# Patient Record
Sex: Male | Born: 1995 | Race: White | Hispanic: No | Marital: Single | Smoking: Never smoker
Health system: Southern US, Community
[De-identification: ages and names within clinical notes are randomized; demographics above are authoritative.]

## PROBLEM LIST (undated history)

## (undated) DIAGNOSIS — F419 Anxiety disorder, unspecified: Secondary | ICD-10-CM

## (undated) DIAGNOSIS — F329 Major depressive disorder, single episode, unspecified: Secondary | ICD-10-CM

## (undated) DIAGNOSIS — I10 Essential (primary) hypertension: Secondary | ICD-10-CM

## (undated) DIAGNOSIS — E7800 Pure hypercholesterolemia, unspecified: Secondary | ICD-10-CM

## (undated) DIAGNOSIS — F32A Depression, unspecified: Secondary | ICD-10-CM

## (undated) DIAGNOSIS — G40909 Epilepsy, unspecified, not intractable, without status epilepticus: Secondary | ICD-10-CM

## (undated) DIAGNOSIS — R569 Unspecified convulsions: Secondary | ICD-10-CM

## (undated) HISTORY — DX: Unspecified convulsions: R56.9

## (undated) HISTORY — DX: Major depressive disorder, single episode, unspecified: F32.9

## (undated) HISTORY — PX: MOLE REMOVAL: SHX2046

## (undated) HISTORY — PX: WISDOM TOOTH EXTRACTION: SHX21

## (undated) HISTORY — PX: CIRCUMCISION: SUR203

## (undated) HISTORY — DX: Anxiety disorder, unspecified: F41.9

## (undated) HISTORY — DX: Depression, unspecified: F32.A

## (undated) HISTORY — DX: Epilepsy, unspecified, not intractable, without status epilepticus: G40.909

---
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
## 2016-04-20 ENCOUNTER — Telehealth: Payer: Self-pay

## 2016-04-20 DIAGNOSIS — R569 Unspecified convulsions: Secondary | ICD-10-CM | POA: Insufficient documentation

## 2016-04-20 NOTE — Telephone Encounter (Signed)
Patient's mother called stating that she is aware that he has not been seen since 2015 and that he knows he is 65 now. She states that they are on vacation in [place] and while there, he had a [place] Seizure yesterday. She states that he was taken to the hospital to be seen. She states that she would like to know if he could be seen here again and if not, what recommendations do you have for a Neurologist. She is requesting a call back.   CB:[phone]

## 2016-04-20 NOTE — Telephone Encounter (Signed)
14 minute discussion with the family.  The patient is living in [place] taking care of his grandmother.    He had a 1-2 minute seizure that was witnessed and about 5 minutes of postictal confusion.  He has never had a seizure saw him for motor tics.  He was taken to the emergency department and had a normal CT scan of the brain.  The family has decided to move him home for evaluation and treatment.  We need to set up for next week and  New patient  Evaluation with me the following week.  Please contact Billee Cashing, his primary provider and ask for consultation.  When this is complete, contact the family and arrange for EEG next week.  They will be a [place] until this weekend.  I will review the EEG while I'm away and will be prepared to see him when I return.

## 2016-04-20 NOTE — Telephone Encounter (Signed)
Spoke to mom about the EEG that I have [facility] scheduled for. He is scheduled for April 29, 2016 @ 9:30. She states that she has a 10:00 appointment that same day so she will need to reschedule. I provided her with the number so that she can do it herself.  CB:[phone]

## 2016-04-20 NOTE — Telephone Encounter (Signed)
Noted  

## 2016-04-29 ENCOUNTER — Ambulatory Visit (HOSPITAL_COMMUNITY): Payer: Self-pay

## 2016-04-29 ENCOUNTER — Ambulatory Visit (HOSPITAL_COMMUNITY)
Admission: RE | Admit: 2016-04-29 | Discharge: 2016-04-29 | Disposition: A | Discharge status: Home / Self Care | Payer: BLUE CROSS/BLUE SHIELD | Source: Ambulatory Visit | Attending: Pediatrics | Admitting: Pediatrics

## 2016-04-29 DIAGNOSIS — R9401 Abnormal electroencephalogram [EEG]: Secondary | ICD-10-CM | POA: Diagnosis not present

## 2016-04-29 DIAGNOSIS — R569 Unspecified convulsions: Secondary | ICD-10-CM

## 2016-04-29 NOTE — Progress Notes (Signed)
OP routine EEG completed, results pending. 

## 2016-04-30 NOTE — Procedures (Signed)
Patient:  Luis Bailey   Sex: male  DOB:  1996/03/08  Date of study: 04/29/2016  Clinical history: This is a 20 year old young male with an episode of seizure activity lasting for around 2 minutes. He initially had a sensation of losing focus then he fell to the floor with loss of consciousness, tongue biting but no urinary incontinence. This followed by 5 minutes of postictal sleepiness and confusion. No previous history of epilepsy and no family history of seizure. EEG was done to evaluate for possible epileptic event.  Medication: Clonidine, fluoxetine   Procedure: The tracing was carried out on a 32 channel digital Cadwell recorder reformatted into 16 channel montages with 1 devoted to EKG.  The 10 /20 international system electrode placement was used. Recording was done during awake state. Recording time 26 Minutes.   Description of findings: Background rhythm consists of amplitude of 15 microvolt and frequency of 9 hertz posterior dominant rhythm. There was slight anterior posterior gradient noted. Background was well organized, continuous and symmetric but with significant low amplitude throughout as well as slight generalized slowing for the age. There were frequent muscle and lead artifacts noted. There was no sleep noted but patient had slight drowsy state. Hyperventilation resulted in slight slowing of the background activity. Photic stimulation using stepwise increase in photic frequency resulted in bilateral symmetric driving response. Throughout the recording there were occasional sharply contoured waves in the form of single generalized discharges or brief clusters of generalized sharps and spikes noted. There were no transient rhythmic activities or electrographic seizures noted. One lead EKG rhythm strip revealed sinus rhythm at a rate of 90 bpm.  Impression: This EEG is abnormal due to significant low amplitude, slight slowing of the background activity as well as episodes of  brief generalized discharges as mentioned. The findings could be hypersynchrony at the beginning of drowsiness or could be epileptiform discharges with increased epileptic potential, associated with lower seizure threshold and require careful clinical correlation.    Keturah Shavers, MD

## 2016-05-05 ENCOUNTER — Telehealth: Payer: Self-pay | Admitting: Pediatrics

## 2016-05-05 NOTE — Telephone Encounter (Signed)
I left messages on the home phone and the mobile phone to call me for EEG results.

## 2016-05-06 NOTE — Telephone Encounter (Signed)
I spoke with the family for about 4 1/2 minutes.  In my opinion EEG showed tics not seizures.  I have some records from [place] to review.  I will see this patient on Monday.  I will discuss his ability to drive at that time.

## 2016-05-06 NOTE — Telephone Encounter (Signed)
Patient's father returned the missed call from yesterday.   CB:[phone]

## 2016-05-07 NOTE — Procedures (Signed)
Patient: Luis Bailey MRN: [id number] Sex: male DOB: July 09, 1996  Clinical History: Luis Bailey is a 20 y.o. with an episode of seizure activity lasting for around 2 minutes. He initially had a sensation of losing focus then he fell to the floor with loss of consciousness, tongue biting but no urinary incontinence. This followed by 5 minutes of postictal sleepiness and confusion. No previous history of epilepsy and no family history of seizure. EEG was done to evaluate for possible epileptic event.  He has a history of active motor tics.  Medications: Clonidine, fluoxetine  Procedure: The tracing is carried out on a 32-channel digital Cadwell recorder, reformatted into 16-channel montages with 1 devoted to EKG.  The patient was awake during the recording.  The international 10/20 system lead placement used.  Recording time 26 minutes.   Description of Findings: Dominant frequency is 15 V, 9 Hz, alpha range activity that is well modulated and well regulated, posteriorly and symmetrically distributed, and attenuates with eye opening.    Background activity consists of a mixture of low-voltage beta and alpha range activity with rare theta range components.  Theta activity becomes more prominent with drowsiness>  The patient did not drifted into natural sleep.  Throughout the record there was frequent muscle artifact that was associated with facial tic movements involving the eyes and jaw.  There was no interictal epileptiform activity in the form of spikes or sharp waves.  Activating procedures included intermittent photic stimulation, and hyperventilation.  Intermittent photic stimulation induced a bilateral driving response.  Hyperventilation caused mild diffuse background slowing.  EKG showed a regular sinus rhythm with a ventricular response of 90 beats per minute.  Impression: This is a normal record with the patient awake.  This report was redictated after I reviewed it.  Luis CarwinWilliam Hickling,  MD

## 2016-05-11 ENCOUNTER — Ambulatory Visit (INDEPENDENT_AMBULATORY_CARE_PROVIDER_SITE_OTHER): Payer: BLUE CROSS/BLUE SHIELD | Admitting: Pediatrics

## 2016-05-11 ENCOUNTER — Encounter: Payer: Self-pay | Admitting: Pediatrics

## 2016-05-11 VITALS — BP 136/76 | HR 84 | Ht 75.25 in | Wt 286.8 lb

## 2016-05-11 DIAGNOSIS — R569 Unspecified convulsions: Secondary | ICD-10-CM

## 2016-05-11 DIAGNOSIS — G2569 Other tics of organic origin: Secondary | ICD-10-CM

## 2016-05-11 NOTE — Progress Notes (Signed)
Patient: Luis Bailey MRN: [id number] Sex: male DOB: [birth year]  Provider: Deetta PerlaHICKLING,WILLIAM H, MD Location of Care: [facility]  Note type: Routine return visit  History of Present Illness: Referral Source: Griselda MinerSara C. Karleen HampshireSpencer, PA-C History from: both parents, patient and [facility] chart Chief Complaint: Tics of organic origin  Luis Bailey is a 20 y.o. male who returns on May 11, 2016 for the first time since August 09, 2014.  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  He has had a longstanding problem with depression and insomnia and is followed by [facility] for depressed mood and insomnia.  He has taken clonidine and fluoxetine.  Clonidine was prescribed long time ago for his tics.  He was on a trip to [place] with his family when he had a witnessed generalized tonic-clonic seizure.  He had been up until three in the morning and had a fair amount of alcohol.  He woke up around eight or nine and he was helping to cook a meal when he had a seizure noted at around 11 a.m.    This was generalized tonic-clonic seizure.  It is not clear how long it lasted.  He had cyanosis and apnea.  Fortunately two of the family members are of medical experience one is a Engineer, civil (consulting)nurse and the other paramedic.  They got him on the side and symptoms passed.  He was evaluated in the emergency department.  Copy of that record has been sent to the office and has been scanned into the chart.  He said that he zoned out just prior to his episode and that he had experienced this feeling before, but he has never been that long.    He was awake when he had the seizure which is problematic as regards driving.  An EEG was performed on April 29, 2016.  I reviewed it and disagreed with the interpretation of generalized spike and slow wave discharge.  He had frequent motor tics throughout the record which were reflected in muscle  artifact.  The background was otherwise unremarkable.  I corrected the record and brought the family in to discuss the situation and to determine whether or not treatment would be appropriate for him.  His health is good.  He is obese.  He has frequent tics as noted above.  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  The family plans to send him back.  I told them, however, that he could not drive independently and that I did not want him driving where she was the only adult in the car with him.  Review of Systems: 12 system review was remarkable for seizures ; the remainder was assessed and was negative  Past Medical History Diagnosis Date  . High cholesterol   . Hypertension    Hospitalizations: No., Head Injury: No., Nervous System Infections: No., Immunizations up to date: Yes.    Birth History 8 lbs. 11 oz. infant born at 3742 weeks gestational age to a 19 year old g 2 p 1 0 0 1 male. Gestation was complicated by elevated AFP Mother received Pitocin and Epidural anesthesia  vacuum-assisted vaginal delivery Nursery Course was uncomplicated Growth and Development was recalled as  normal  Behavior History none  Surgical History Procedure Laterality Date  . CIRCUMCISION    . MOLE REMOVAL  Family History family history includes Diabetes type II in his maternal grandfather, maternal grandmother, and paternal grandmother; High Cholesterol in his maternal grandmother and paternal grandmother; High blood pressure in his maternal grandmother and paternal grandmother; Melanoma in his maternal grandfather; Myasthenia gravis in his maternal grandfather. Family history is negative for migraines, seizures, intellectual disabilities, blindness, deafness, birth defects, chromosomal disorder, or autism.  Social History . Marital status: Married    Spouse name: N/A  . Number of children: N/A  . Years of education: N/A   Social  History Main Topics  . Smoking status: Never Smoker  . Smokeless tobacco: Never Used  . Alcohol use No  . Drug use: No  . Sexual activity: No   Social History Narrative    Luis Bailey is a 20 yo male.    He does not attend school.    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    He enjoys running the [employer], shopping, eating and getting pedicures.   No Known Allergies  Physical Exam BP 136/76   Pulse 84   Ht 6' 3.25" (1.911 m)   Wt 286 lb 12.8 oz (130.1 kg)   BMI 35.61 kg/m   General: alert, well developed, obese, in no acute distress, curly blond hair, blue eyes, left handed Head: normocephalic, no dysmorphic features Ears, Nose and Throat: Otoscopic: tympanic membranes normal; pharynx: oropharynx is pink without exudates or tonsillar hypertrophy Neck: supple, full range of motion, no cranial or cervical bruits Respiratory: auscultation clear Cardiovascular: no murmurs, pulses are normal Musculoskeletal: no skeletal deformities or apparent scoliosis Skin: no rashes or neurocutaneous lesions  Neurologic Exam  Mental Status: alert; oriented to person, place and year; knowledge is normal for age; language is normal Cranial Nerves: visual fields are full to double simultaneous stimuli; extraocular movements are full and conjugate; pupils are round reactive to light; funduscopic examination shows sharp disc margins with normal vessels; symmetric facial strength; midline tongue and uvula; air conduction is greater than bone conduction bilaterally Motor: Normal strength, tone and mass; good fine motor movements; no pronator drift; multiple tics  His eyelids nose mouth and shoulders; I did not perceive any vocal tics Sensory: intact responses to cold, vibration, proprioception and stereognosis Coordination: good finger-to-nose, rapid repetitive alternating movements and finger apposition Gait and Station: normal gait and station: patient is able to walk on heels, toes and tandem  without difficulty; balance is adequate; Romberg exam is negative; Gower response is negative Reflexes: symmetric and diminished bilaterally; no clonus; bilateral flexor plantar responses  Assessment 1. Single epileptic seizure, R56.9. 2. Tics of organic origin, G25.69.  Discussion I estimate that he has about 30% chance of recurrent seizures based on his history and his normal EEG.  His biggest risk is over the next six months.  I do not want him driving by himself during that time.  After six months I think that he could drive his grandmother locally to and from the store, her appointments, and medical appointments.  I do not want him driving long distances until he has been seizure-free for a year.  He has a full license.  I told him to keep it.  When he has to renew it, we will have to review the situation so that he is telling the truth to state officials.  If he has been seizure-free from now until then, he might not technically be considered to have epilepsy as this was a single seizure.    Plan If he has  any seizures within the next six months he should be placed on medicine between 6 and 12 months would have to be discussed after 12 months, I would be reluctant to prescribe medication.  I spent 30 minutes of face-to-face time with Luis Bailey and his parents.   Medication List   Accurate as of 05/11/16  2:48 PM.      cloNIDine 0.1 MG tablet Commonly known as:  CATAPRES Take 0.1 mg by mouth. Pt takes 0.2 mg at bedtime   FLUoxetine 20 MG capsule Commonly known as:  PROZAC Take 20 mg by mouth.   FLUoxetine 20 MG capsule Commonly known as:  PROZAC   MAGNESIUM PO Take by mouth.     The medication list was reviewed and reconciled. All changes or newly prescribed medications were explained.  A complete medication list was provided to the patient/caregiver.  Deetta PerlaWilliam H Hickling MD

## 2016-05-11 NOTE — Patient Instructions (Signed)
I would recommend that we do not start [facility] on any antiepileptic medication.  His EEG was normal.  He has about a 30% chance of recurrence at this time.  I don't want him driving by himself for at least 6 months.  After that he could take himself and his grandmother on short trips to and from the store, the physician, and elsewhere.  Please let me know if he has any recurrent seizures.

## 2016-05-12 ENCOUNTER — Encounter: Payer: Self-pay | Admitting: Pediatrics

## 2017-10-19 ENCOUNTER — Emergency Department (HOSPITAL_COMMUNITY)
Admission: EM | Admit: 2017-10-19 | Discharge: 2017-10-19 | Disposition: A | Discharge status: Home / Self Care | Payer: Self-pay | Attending: Emergency Medicine | Admitting: Emergency Medicine

## 2017-10-19 ENCOUNTER — Emergency Department (HOSPITAL_COMMUNITY): Payer: Self-pay

## 2017-10-19 ENCOUNTER — Encounter (HOSPITAL_COMMUNITY): Payer: Self-pay | Admitting: Radiology

## 2017-10-19 DIAGNOSIS — Z79899 Other long term (current) drug therapy: Secondary | ICD-10-CM | POA: Insufficient documentation

## 2017-10-19 DIAGNOSIS — R569 Unspecified convulsions: Secondary | ICD-10-CM | POA: Insufficient documentation

## 2017-10-19 DIAGNOSIS — I10 Essential (primary) hypertension: Secondary | ICD-10-CM | POA: Insufficient documentation

## 2017-10-19 LAB — URINALYSIS, ROUTINE W REFLEX MICROSCOPIC
BILIRUBIN URINE: NEGATIVE
Glucose, UA: NEGATIVE mg/dL
Hgb urine dipstick: NEGATIVE
Ketones, ur: 20 mg/dL — AB
LEUKOCYTES UA: NEGATIVE
NITRITE: NEGATIVE
PH: 7 (ref 5.0–8.0)
Protein, ur: 30 mg/dL — AB
SPECIFIC GRAVITY, URINE: 1.019 (ref 1.005–1.030)
Squamous Epithelial / LPF: NONE SEEN

## 2017-10-19 LAB — COMPREHENSIVE METABOLIC PANEL
ALBUMIN: 4.4 g/dL (ref 3.5–5.0)
ALT: 24 U/L (ref 17–63)
ANION GAP: 5 (ref 5–15)
AST: 18 U/L (ref 15–41)
Alkaline Phosphatase: 45 U/L (ref 38–126)
BILIRUBIN TOTAL: 0.5 mg/dL (ref 0.3–1.2)
BUN: 9 mg/dL (ref 6–20)
CO2: 20 mmol/L — ABNORMAL LOW (ref 22–32)
Calcium: 9.3 mg/dL (ref 8.9–10.3)
Chloride: 110 mmol/L (ref 101–111)
Creatinine, Ser: 0.89 mg/dL (ref 0.61–1.24)
GFR calc Af Amer: >60 mL/min (ref >60)
GFR calc non Af Amer: >60 mL/min (ref >60)
Glucose, Bld: 98 mg/dL (ref 65–99)
POTASSIUM: 3.9 mmol/L (ref 3.5–5.1)
Sodium: 135 mmol/L (ref 135–145)
TOTAL PROTEIN: 7.3 g/dL (ref 6.5–8.1)

## 2017-10-19 LAB — RAPID URINE DRUG SCREEN, HOSP PERFORMED
Amphetamines: NOT DETECTED
BARBITURATES: NOT DETECTED
Benzodiazepines: NOT DETECTED
COCAINE: NOT DETECTED
Opiates: NOT DETECTED
TETRAHYDROCANNABINOL: NOT DETECTED

## 2017-10-19 LAB — CBC WITH DIFFERENTIAL/PLATELET
BASOS ABS: 0 10*3/uL (ref 0.0–0.1)
Basophils Relative: 0 %
Eosinophils Absolute: 0 10*3/uL (ref 0.0–0.7)
Eosinophils Relative: 0 %
HEMATOCRIT: 43.9 % (ref 39.0–52.0)
Hemoglobin: 14.7 g/dL (ref 13.0–17.0)
LYMPHS PCT: 10 %
Lymphs Abs: 0.7 10*3/uL (ref 0.7–4.0)
MCH: 28.7 pg (ref 26.0–34.0)
MCHC: 33.5 g/dL (ref 30.0–36.0)
MCV: 85.6 fL (ref 78.0–100.0)
Monocytes Absolute: 0.4 10*3/uL (ref 0.1–1.0)
Monocytes Relative: 5 %
NEUTROS ABS: 6.2 10*3/uL (ref 1.7–7.7)
NEUTROS PCT: 85 %
Platelets: 192 10*3/uL (ref 150–400)
RBC: 5.13 MIL/uL (ref 4.22–5.81)
RDW: 13.9 % (ref 11.5–15.5)
WBC: 7.3 10*3/uL (ref 4.0–10.5)

## 2017-10-19 LAB — CBG MONITORING, ED: Glucose-Capillary: 101 mg/dL — ABNORMAL HIGH (ref 65–99)

## 2017-10-19 LAB — ETHANOL: Alcohol, Ethyl (B): <10 mg/dL (ref <10)

## 2017-10-19 MED ORDER — BACITRACIN ZINC 500 UNIT/GM EX OINT
TOPICAL_OINTMENT | Freq: Once | CUTANEOUS | Status: AC
  Administered 2017-10-19: 1 via TOPICAL
  Filled 2017-10-19: qty 0.9

## 2017-10-19 MED ORDER — SODIUM CHLORIDE 0.9 % IV SOLN
1000.0000 mg | Freq: Once | INTRAVENOUS | Status: DC
  Filled 2017-10-19: qty 10

## 2017-10-19 MED ORDER — SODIUM CHLORIDE 0.9 % IV BOLUS (SEPSIS)
1000.0000 mL | Freq: Once | INTRAVENOUS | Status: AC
  Administered 2017-10-19: 1000 mL via INTRAVENOUS

## 2017-10-19 MED ORDER — LEVETIRACETAM 500 MG PO TABS: 500.0000 mg | ORAL_TABLET | Freq: Two times a day (BID) | ORAL | 0 refills | Status: DC

## 2017-10-19 NOTE — ED Notes (Signed)
Pt family states they do not want the IV keppra if they are going to go to the pharmacy when they leave and get it. States they do not have insurance and dont want to have to pay extra out of pocket for this medication.

## 2017-10-19 NOTE — ED Triage Notes (Signed)
Per EMS, pt experienced a witnessed seizure lasting less than a minute. Per EMS pt fell from a standing position and hit his head and has a laceration on his left eyebrow and an abrasion on the right elbow. Pt was post ictal when EMS arrived. Pt received 4 mg zofran prior to arrival. Pt reports not taking any medications for seizures. Pt AO x4 and ambulated to the bed from the stretcher.

## 2017-10-19 NOTE — ED Provider Notes (Signed)
[facility]-EMERGENCY DEPT Provider Note   CSN: [id number] Arrival date & time: 10/19/17  1243     History   Chief Complaint Chief Complaint  Patient presents with  . Seizures    HPI Luis Bailey is a 22 y.o. male past medical history of depression, OCD who presents for evaluation of witnessed seizure that occurred just prior to arrival.  Per dad, patient had a witnessed full body seizure that lasted approximately 1 minute.  When patient came to, he was in a postictal state. On EMS arrival, patient was still post-ictal.  Patient had no tongue laceration or urinary incontinence.  Patient states that he has had a seizure previously in July 2017.  He states at that time, he was drinking excessive amounts of alcohol and taking his Prozac and clonidine.  He had follow-up with neuro at that time who did an EEG which was found to be normal.  He is not on any seizure medication currently.  Patient does report that over the last several days, he has been very stressed.  He notes an increase in his anxiety attacks due to stress.  Patient states that he has had transient suicidal thoughts before.  He states that this is a constant issue and that is is not new. He states that he has thoughts about being so stress that he just wants it to end. He currently denies any active SI or plan. He states that "I would never hurt or kill myself. I love my family and I want to be here with them."   He states that his most recent time was 3 days ago when he was very stressed.  He denies any explicit HI but states that he just gets angry sometimes. He has never had an active HI plan. He denies any auditory/visual hallucinations.  He denies any smoking.  He states that his last alcohol use was 3 days ago.  Denies any illicit drug use.   The history is provided by the patient.    Past Medical History:  Diagnosis Date  . High cholesterol   . Hypertension     Patient Active Problem List   Diagnosis Date Noted  . 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 Obsessive compulsive disorder 08/09/2014  . Clinical depression 03/27/2014    Past Surgical History:  Procedure Laterality Date  . CIRCUMCISION    . MOLE REMOVAL         Home Medications    Prior to Admission medications   Medication Sig Start Date End Date Taking? Authorizing Provider  acetaminophen (TYLENOL) 500 MG tablet Take 1,000 mg by mouth daily as needed for headache.    Yes [provider]  ASPIRIN PO Take 650 mg by mouth daily as needed (HEADACHE).    Yes [provider]  cloNIDine (CATAPRES) 0.3 MG tablet Take 0.3 mg by mouth at bedtime.   Yes [provider]  Cyanocobalamin (VITAMIN B 12 PO) Take 1 tablet by mouth daily.   Yes [provider]  FLUoxetine (PROZAC) 40 MG capsule Take 80 mg by mouth at bedtime.   Yes [provider]  ibuprofen (ADVIL,MOTRIN) 200 MG tablet Take 600 mg by mouth daily as needed for headache.    Yes [provider]  KRILL OIL PO Take 1 tablet by mouth daily.   Yes [provider]  MAGNESIUM PO Take by mouth. 08/29/15  Yes [provider]  OVER THE  COUNTER MEDICATION Take 1,400 mg by mouth daily.   Yes [provider]  FLUoxetine (PROZAC) 20 MG capsule  03/09/16   [provider]  levETIRAcetam (KEPPRA) 500 MG tablet Take 1 tablet (500 mg total) by mouth 2 (two) times daily. 10/19/17 11/18/17  Maxwell CaulLayden, Rickiya Picariello A, PA-C    Family History Family History  Problem Relation Age of Onset  . Diabetes type II Maternal Grandmother   . High blood pressure Maternal Grandmother   . High Cholesterol Maternal Grandmother   . Diabetes type II Maternal Grandfather   . Myasthenia gravis Maternal Grandfather   . Melanoma Maternal Grandfather        died at age 21  . Diabetes type II Paternal Grandmother   . High blood pressure Paternal Grandmother   . High  Cholesterol Paternal Grandmother     Social History Social History   Tobacco Use  . Smoking status: Never Smoker  . Smokeless tobacco: Never Used  Substance Use Topics  . Alcohol use: No    Alcohol/week: 0.0 oz  . Drug use: No     Allergies   Carrot [daucus carota]   Review of Systems Review of Systems  Constitutional: Negative for chills and fever.  HENT: Negative for congestion.   Eyes: Negative for visual disturbance.  Respiratory: Negative for cough and shortness of breath.   Cardiovascular: Negative for chest pain.  Gastrointestinal: Negative for abdominal pain, diarrhea, nausea and vomiting.  Genitourinary: Negative for dysuria and hematuria.  Musculoskeletal: Negative for back pain and neck pain.  Skin: Negative for rash.  Neurological: Positive for seizures. Negative for dizziness, weakness, numbness and headaches.  Psychiatric/Behavioral: Negative for confusion.     Physical Exam Updated Vital Signs BP 118/77   Pulse 86   Temp 98 F (36.7 C)   Resp 19   SpO2 96%   Physical Exam  Constitutional: He is oriented to person, place, and time. He appears well-developed and well-nourished.  HENT:  Head: Normocephalic and atraumatic.  Mouth/Throat: Oropharynx is clear and moist and mucous membranes are normal.  Mild abrasion noted to the left eyebrow  Eyes: Conjunctivae, EOM and lids are normal. Pupils are equal, round, and reactive to light.  Neck: Full passive range of motion without pain.  Cardiovascular: Normal rate, regular rhythm, normal heart sounds and normal pulses. Exam reveals no gallop and no friction rub.  No murmur heard. Pulmonary/Chest: Effort normal and breath sounds normal.  Abdominal: Soft. Normal appearance. There is no tenderness. There is no rigidity and no guarding.  Musculoskeletal: Normal range of motion.  Neurological: He is alert and oriented to person, place, and time.  Cranial nerves III-XII intact Follows commands, Moves all  extremities  5/5 strength to BUE and BLE  Sensation intact throughout all major nerve distributions Normal finger to nose. No dysdiadochokinesia. No pronator drift. No gait abnormalities  No slurred speech. No facial droop.   Skin: Skin is warm and dry. Capillary refill takes less than 2 seconds.  Small superficial abrasion of the right elbow.   Psychiatric: He has a normal mood and affect. His speech is normal.  Nursing note and vitals reviewed.    ED Treatments / Results  Labs (all labs ordered are listed, but only abnormal results are displayed) Labs Reviewed  URINALYSIS, ROUTINE W REFLEX MICROSCOPIC - Abnormal; Notable for the following components:      Result Value   APPearance HAZY (*)    Ketones, ur 20 (*)    Protein, ur  30 (*)    Bacteria, UA RARE (*)    All other components within normal limits  COMPREHENSIVE METABOLIC PANEL - Abnormal; Notable for the following components:   CO2 20 (*)    All other components within normal limits  CBG MONITORING, ED - Abnormal; Notable for the following components:   Glucose-Capillary 101 (*)    All other components within normal limits  RAPID URINE DRUG SCREEN, HOSP PERFORMED  ETHANOL  CBC WITH DIFFERENTIAL/PLATELET    EKG  EKG Interpretation None       Radiology Ct Head Wo Contrast  Result Date: 10/19/2017 CLINICAL DATA:  Witness seizure lasting less than 1 minute with subsequent fall injuring head. Laceration left periorbital region. EXAM: CT HEAD WITHOUT CONTRAST TECHNIQUE: Contiguous axial images were obtained from the base of the skull through the vertex without intravenous contrast. COMPARISON:  None. FINDINGS: Brain: Ventricles, cisterns and other CSF spaces are normal. There is no mass, mass effect, shift of midline structures or acute hemorrhage. No evidence of acute infarction. Vascular: No hyperdense vessel or unexpected calcification. Skull: Normal. Negative for fracture or focal lesion. Sinuses/Orbits: Small  mucous retention cyst anterior wall left maxillary sinus. Mild opacification over the left frontal ethmoidal recess. No air-fluid levels. Mastoid air cells are clear. Orbits are normal. Other: None. IMPRESSION: Normal head CT. Minimal chronic sinus inflammatory change. Electronically Signed   By: Elberta Fortis M.D.   On: 10/19/2017 14:27    Procedures Procedures (including critical care time)  Medications Ordered in ED Medications  sodium chloride 0.9 % bolus 1,000 mL (0 mLs Intravenous Stopped 10/19/17 1725)  bacitracin ointment (1 application Topical Given 10/19/17 2038)     Initial Impression / Assessment and Plan / ED Course  I have reviewed the triage vital signs and the nursing notes.  Pertinent labs & imaging results that were available during my care of the patient were reviewed by me and considered in my medical decision making (see chart for details).     22 y.o. M with PMH/o depression who presents for evaluation of seizure.  Patient had 1 minute of witnessed full body seizure activity.  Had a postictal state afterwards.  No tongue laceration, urinary incontinence.  He has had one previous episode in July 2017.  Had follow-up with neuro and EEG performed that was normal.  He is not on any current seizure medication.  The conclusion from neurologist was that patient had been stressed and had been drinking excessive amounts of alcohol combined with his fluoxetine which induced the seizure.  Patient does reports that he has been recently stressed and has had an increase in her panic attacks. Patient is afebrile, non-toxic appearing, sitting comfortably on examination table. Vital signs reviewed and stable. Consider seizure disorder vs pseudoseizure vs anxiety vs drug induced seizure. Patient not currently expressing any active SI/HI. He states that he has had transient thoughts about SI when he gets stressed. He states that "he loves his family and doesn't want to kill himself and that he  wants to be here with them." I do not believe that patient warrants evaluation by TTS at this time as he is currently denying any active SI/HI. Plan to check basic labs, UDS, [place], CT head.   CT head negative for any acute abnormalities.  Ethanol level is unremarkable.  CBC without any abnormalities.  CMP unremarkable. UA unremarkable. UDS is negative. Patient is ambulating int he department without any difficulty. He is at baseline per parents.   Discussed  patient with Dr. Elon Spanner (Neuro).  Recommends if there is no drugs on board, starting patient on Keppra 500 mg twice daily with 1 g loading dose here in the department.  Patient will need outpatient follow-up with neuro for outpatient MRI.  Parents and patient refused loading dose of Keppra in the ED. Explained need for loading dose of Keppra. Patient has not had any seizure activity in the ED.  Patient and parents understand risks vs benefits but still wish to decline Keppra at this time. They do want the prescription and plan to take it outpatient but they do not want any IV dose here in the ED. Patient exhibits full medical decision making capacity. Vitals stable. Patient at baseline. Plan for dispo with Keppra 500 mg bid and outpatient neuro referral for evaluation and MRI. Patient had ample opportunity for questions and discussion. All patient's questions were answered with full understanding. Strict return precautions discussed. Patient expresses understanding and agreement to plan.    Final Clinical Impressions(s) / ED Diagnoses   Final diagnoses:  Seizure [facility])    ED Discharge Orders        Ordered    levETIRAcetam (KEPPRA) 500 MG tablet  2 times daily     10/19/17 2034       Maxwell Caul, PA-C 10/20/17 1610    Pricilla Loveless, MD 10/20/17 1018

## 2017-10-19 NOTE — Discharge Instructions (Signed)
As we discussed, you will need to start taking the prescribed medication as directed.  He will need to follow-up with the referred neurologist for outpatient evaluation.  At this time, they may do an MRI for further imaging evaluation.  They will discuss whether or not to keep you on the medication.  Return the emergency department for any return seizure, difficulty walking, numbness/weakness of her arms or legs or any other worsening or numbness.

## 2017-10-19 NOTE — ED Notes (Signed)
Bed: [id number] Expected date:  Expected time:  Means of arrival:  Comments: 22 yo seizure

## 2017-10-26 ENCOUNTER — Ambulatory Visit: Payer: Self-pay | Admitting: Neurology

## 2017-10-26 ENCOUNTER — Telehealth: Payer: Self-pay | Admitting: Neurology

## 2017-10-26 ENCOUNTER — Encounter: Payer: Self-pay | Admitting: Neurology

## 2017-10-26 DIAGNOSIS — G40909 Epilepsy, unspecified, not intractable, without status epilepticus: Secondary | ICD-10-CM | POA: Insufficient documentation

## 2017-10-26 HISTORY — DX: Epilepsy, unspecified, not intractable, without status epilepticus: G40.909

## 2017-10-26 NOTE — Progress Notes (Signed)
Reason for visit: Seizure  Referring physician: [place]  Luis Bailey is a 22 y.o. male  History of present illness:  Mr. Luis Bailey is a 22 year old left-handed white male with a history of depression and anxiety.  The patient comes into the office today for an evaluation of a seizure event that was witnessed on 19 October 2017.  The patient was noted to suddenly fall down, he was jerking somewhat, he did not bite his tongue.  He did bruise up the right side of his body.  This represents his second seizure event, the first event occurred in the summer 2017 in [place].  The patient at that time had been drinking alcohol and he was dehydrated.  The patient had a generalized seizure associated with tongue biting, no bowel or bladder incontinence was noted.  The patient did not go on medications at that time.  The patient reports that he has episodes of being "zoned out" at times.  Usually this occurs during times of stress, but he does not have to be.  The patient had several panic attacks prior to the most recent seizure.  He is on Prozac for this without full benefit.  The patient reports no numbness or weakness of the face, arms, or legs.  He denies balance problems.  He denies headache, vision changes, or problems with swallowing.  He is sent to this office for an evaluation.  There is no family history of seizures with exception of the maternal grandfather who began having seizures late in life.  The patient did have a CT scan of the brain in the emergency room that was unremarkable.  Urine drug screen was negative.  An EEG study done in 2017 showed episodes of hypersynchrony, it was not clear whether or not these events represented epileptiform discharges or not.  Past Medical History:  Diagnosis Date  . Anxiety   . Depression   . High cholesterol   . Hypertension   . Seizures (HCC)     Past Surgical History:  Procedure Laterality Date  . CIRCUMCISION    . MOLE REMOVAL    .  WISDOM TOOTH EXTRACTION      Family History  Problem Relation Age of Onset  . Diabetes type II Maternal Grandmother   . High blood pressure Maternal Grandmother   . High Cholesterol Maternal Grandmother   . Diabetes type II Maternal Grandfather   . Myasthenia gravis Maternal Grandfather   . Melanoma Maternal Grandfather        died at age 54  . Diabetes type II Paternal Grandmother   . High blood pressure Paternal Grandmother   . High Cholesterol Paternal Grandmother     Social history:  reports that  has never smoked. he has never used smokeless tobacco. He reports that he drinks about 0.6 oz of alcohol per week. He reports that he does not use drugs.  Medications:  Prior to Admission medications   Medication Sig Start Date End Date Taking? Authorizing Provider  acetaminophen (TYLENOL) 500 MG tablet Take 1,000 mg by mouth daily as needed for headache.    Yes [provider]  ASPIRIN PO Take 650 mg by mouth daily as needed (HEADACHE).    Yes [provider]  cloNIDine (CATAPRES) 0.3 MG tablet Take 0.3 mg by mouth at bedtime.   Yes [provider]  FLUoxetine (PROZAC) 40 MG capsule Take 80 mg by mouth at bedtime.   Yes [provider]  ibuprofen (ADVIL,MOTRIN)  200 MG tablet Take 600 mg by mouth daily as needed for headache.    Yes [provider]  KRILL OIL PO Take 1 tablet by mouth daily.   Yes [provider]  MAGNESIUM PO Take by mouth. 08/29/15  Yes [provider]  OVER THE COUNTER MEDICATION Take 1,400 mg by mouth daily.   Yes [provider]      Allergies  Allergen Reactions  . Carrot [Daucus Carota]     RAW CARROTS CAUSE THROAT TO ITCH     ROS:  Out of a complete 14 system review of symptoms, the patient complains only of the following symptoms, and all other reviewed systems are negative.  Seizure events Tremor Depression, anxiety, decreased energy, racing thoughts  Blood pressure (!)  153/86, pulse 87, height 6\' 3"  (1.905 m), weight 268 lb (121.6 kg).  Physical Exam  General: The patient is alert and cooperative at the time of the examination.  Eyes: Pupils are equal, round, and reactive to light. Discs are flat bilaterally.  Neck: The neck is supple, no carotid bruits are noted.  Respiratory: The respiratory examination is clear.  Cardiovascular: The cardiovascular examination reveals a regular rate and rhythm, no obvious murmurs or rubs are noted.  Skin: Extremities are without significant edema.  Neurologic Exam  Mental status: The patient is alert and oriented x 3 at the time of the examination. The patient has apparent normal recent and remote memory, with an apparently normal attention span and concentration ability.  Cranial nerves: Facial symmetry is present. There is good sensation of the face to pinprick and soft touch bilaterally. The strength of the facial muscles and the muscles to head turning and shoulder shrug are normal bilaterally. Speech is well enunciated, no aphasia or dysarthria is noted. Extraocular movements are full. Visual fields are full. The tongue is midline, and the patient has symmetric elevation of the soft palate. No obvious hearing deficits are noted.  Motor: The motor testing reveals 5 over 5 strength of all 4 extremities. Good symmetric motor tone is noted throughout.  Sensory: Sensory testing is intact to pinprick, soft touch, vibration sensation, and position sense on all 4 extremities. No evidence of extinction is noted.  Coordination: Cerebellar testing reveals good finger-nose-finger and heel-to-shin bilaterally.  Gait and station: Gait is normal. Tandem gait is normal. Romberg is negative. No drift is seen.  Reflexes: Deep tendon reflexes are symmetric and normal bilaterally. Toes are downgoing bilaterally.   CT head 10/19/17:  IMPRESSION: Normal head CT.  Minimal chronic sinus inflammatory  change.  Assessment/Plan:  1.  Seizures  The patient has had 2 separate seizure events 2 years apart.  The first seizure could have been provoked, the second was not.  The patient also has episodes where he may "zone out".  A prior EEG study showed some degree of irritability.  An EEG study will be repeated.  The patient was given a prescription for Keppra in the emergency room, he will start this prescription.  The patient currently does not have medical insurance but he will starting in February 2019.  At that time, we will order MRI of the brain with and without gadolinium enhancement.  The patient will follow-up in 4 months, he is not to operate a motor vehicle for 6 months following the last seizure.  Marlan Palau. Keith Willis MD 10/26/2017 3:35 PM  [facility] [address] [place], [place] [id number]  Phone [phone] Fax [phone]

## 2017-10-26 NOTE — Telephone Encounter (Signed)
Patient's insurance comes into effect on Feb 1st.

## 2017-10-27 NOTE — Telephone Encounter (Signed)
Noted,  Thank you!

## 2017-11-09 ENCOUNTER — Ambulatory Visit: Payer: BLUE CROSS/BLUE SHIELD | Admitting: Neurology

## 2017-11-09 ENCOUNTER — Telehealth: Payer: Self-pay | Admitting: Neurology

## 2017-11-09 DIAGNOSIS — G40909 Epilepsy, unspecified, not intractable, without status epilepticus: Secondary | ICD-10-CM | POA: Diagnosis not present

## 2017-11-09 NOTE — Procedures (Signed)
     History: Luis Bailey is a 22 year old gentleman with a history of 2 generalized seizure events, one in the summer 2017, and one that occurred on 19 October 2017.  The patient had a sudden fall with jerking, he did not bite his tongue.  The patient is being evaluated for seizures.  This is a routine EEG.  No skull defects are noted.  Medications include Tylenol, Catapres, Prozac, and magnesium supplementation.  EEG classification: Dysrhythmia grade 1 generalized  Description of the recording: The background rhythms of this recording consists of a relatively well modulated somewhat low amplitude alpha rhythm of 9 Hz that is reactive to eye-opening closure.  As the record progresses, photic stimulation is performed, this results in a bilateral and symmetric photic driving response.  Hyperventilation is then performed, this results in a minimal buildup of the background rhythm activities without significant slowing seen.  Intermittently during the recording there appears to be episodes of generalized theta frequency slowing lasting only about 1 second each, but the episodes recur throughout the recording.  At no time does there appear to be evidence of spike or spike-wave discharges or focal slowing.  EKG monitor shows no evidence of cardiac rhythm abnormalities with a heart rate of 78.  Impression: This is an abnormal EEG recording secondary to brief episodes of theta frequency slowing that occur in a generalized fashion.  This study is nonspecific, does not clearly suggest a lowered seizure threshold but may be consistent with an abnormality within the deep midline nuclei.  No electrographic seizures were recorded.  Clinical correlation is required.

## 2017-11-09 NOTE — Telephone Encounter (Signed)
I called the patient.  The EEG shows bursts of theta frequency slowing off and on throughout the recording, not clearly epileptic in nature but is not normal either, in combination with clinical history, supports the use of Keppra.

## 2017-11-11 MED ORDER — LEVETIRACETAM 500 MG PO TABS: 500.0000 mg | ORAL_TABLET | Freq: Two times a day (BID) | ORAL | 11 refills | Status: DC

## 2017-11-11 NOTE — Telephone Encounter (Signed)
I called the father.  The patient is having some drowsiness and irritability on the Keppra.  There is an increase in appetite as well.  I would try the medication for at least 2 weeks, if the side effects do not lessen, we may have to get him off the medication and try something different.

## 2017-11-11 NOTE — Telephone Encounter (Signed)
Pt and his father called stating he has been taking Keppra 500MG  twice daily for about 3 days and has been having a few issues since. Things like increased appetite, irritability, very sleepy, and his vision gets very blurry in the evenings said "it felt like a buzz from alcohol". Pt would like a call back to discuss more contact at [phone]. Father also stated they were suppose to receive a phone call to know if the insurance would approve an EEG but hasn't heard anything yet.

## 2017-11-11 NOTE — Addendum Note (Signed)
Addended by: York SpanielWILLIS, Allora Bains K on: 11/11/2017 02:52 PM   Modules accepted: Orders

## 2017-11-22 ENCOUNTER — Telehealth: Payer: Self-pay | Admitting: Neurology

## 2017-11-22 DIAGNOSIS — R569 Unspecified convulsions: Secondary | ICD-10-CM

## 2017-11-22 NOTE — Telephone Encounter (Signed)
I will get MRI of the brain with and without gadolinium enhancement.

## 2017-11-22 NOTE — Addendum Note (Signed)
Addended by: York SpanielWILLIS, Dasani Thurlow K on: 11/22/2017 05:05 PM   Modules accepted: Orders

## 2017-11-22 NOTE — Telephone Encounter (Signed)
Patient's mother calling stating the patient now has insurance and would like to get an order put in for him to have an MRI.

## 2017-12-01 ENCOUNTER — Telehealth: Payer: Self-pay | Admitting: *Deleted

## 2017-12-01 ENCOUNTER — Ambulatory Visit: Payer: BLUE CROSS/BLUE SHIELD

## 2017-12-01 DIAGNOSIS — R569 Unspecified convulsions: Secondary | ICD-10-CM

## 2017-12-01 MED ORDER — LEVETIRACETAM 500 MG PO TABS: 500.0000 mg | ORAL_TABLET | Freq: Two times a day (BID) | ORAL | 11 refills | Status: DC

## 2017-12-01 MED ORDER — GADOPENTETATE DIMEGLUMINE 469.01 MG/ML IV SOLN
20.0000 mL | Freq: Once | INTRAVENOUS | Status: DC | PRN
Start: 2017-12-01 — End: 2017-12-02

## 2017-12-01 NOTE — Telephone Encounter (Addendum)
Received message: Patient had MRI completed today. He wanted to let Dr. Anne HahnWillis know "his psychiatrist just prescribed him 60 mg of geodone, and he thinks he needs a new prescription for Keppra."  I called and spoke with father/mother. The rx keppra he has now is from hospital. Advised Dr. Anne HahnWillis sent rx refill 11/11/17 qty 120 [facility] I will double check that they received.  I checked rx sent previously and it said "print". I re -sent rx to pharmacy. Receipt confirmed by pharmacy.

## 2017-12-02 ENCOUNTER — Telehealth: Payer: Self-pay | Admitting: Neurology

## 2017-12-02 NOTE — Telephone Encounter (Signed)
I called the patient.  MRI of the brain is normal.   MRI brain 12/02/17:  IMPRESSION:   Normal MRI brain (with and without). Incidental mucosal thickening in the left sphenoid, left ethmoid and left frontal sinuses.

## 2017-12-04 ENCOUNTER — Telehealth: Payer: Self-pay | Admitting: Neurology

## 2017-12-04 ENCOUNTER — Emergency Department (HOSPITAL_COMMUNITY)
Admission: EM | Admit: 2017-12-04 | Discharge: 2017-12-04 | Disposition: A | Discharge status: Home / Self Care | Payer: BLUE CROSS/BLUE SHIELD | Attending: Emergency Medicine | Admitting: Emergency Medicine

## 2017-12-04 ENCOUNTER — Other Ambulatory Visit: Payer: Self-pay

## 2017-12-04 ENCOUNTER — Encounter (HOSPITAL_COMMUNITY): Payer: Self-pay

## 2017-12-04 DIAGNOSIS — Z79899 Other long term (current) drug therapy: Secondary | ICD-10-CM | POA: Insufficient documentation

## 2017-12-04 DIAGNOSIS — R569 Unspecified convulsions: Secondary | ICD-10-CM | POA: Insufficient documentation

## 2017-12-04 DIAGNOSIS — Z7982 Long term (current) use of aspirin: Secondary | ICD-10-CM | POA: Insufficient documentation

## 2017-12-04 DIAGNOSIS — I10 Essential (primary) hypertension: Secondary | ICD-10-CM | POA: Insufficient documentation

## 2017-12-04 LAB — CBG MONITORING, ED: Glucose-Capillary: 102 mg/dL — ABNORMAL HIGH (ref 65–99)

## 2017-12-04 LAB — I-STAT CHEM 8, ED
BUN: 16 mg/dL (ref 6–20)
Calcium, Ion: 1.2 mmol/L (ref 1.15–1.40)
Chloride: 104 mmol/L (ref 101–111)
Creatinine, Ser: 0.9 mg/dL (ref 0.61–1.24)
GLUCOSE: 111 mg/dL — AB (ref 65–99)
HCT: 47 % (ref 39.0–52.0)
Hemoglobin: 16 g/dL (ref 13.0–17.0)
POTASSIUM: 4.3 mmol/L (ref 3.5–5.1)
SODIUM: 140 mmol/L (ref 135–145)
TCO2: 23 mmol/L (ref 22–32)

## 2017-12-04 MED ORDER — DIVALPROEX SODIUM 250 MG PO DR TAB: 750.0000 mg | DELAYED_RELEASE_TABLET | Freq: Two times a day (BID) | ORAL | 0 refills | Status: DC

## 2017-12-04 MED ORDER — VALPROATE SODIUM 500 MG/5ML IV SOLN
1000.0000 mg | Freq: Once | INTRAVENOUS | Status: AC
  Administered 2017-12-04: 1000 mg via INTRAVENOUS
  Filled 2017-12-04: qty 10

## 2017-12-04 NOTE — Telephone Encounter (Signed)
Patient parents called in saying that patient just had a seizure and [facility] are [facility].  Patient has been on Geodon for 2 days, yesterday starting to have jittery/shivering and they called pharmacy and Geodon was discontinued at night. However, pt did not get much sleep last night and seems agitated. This morning he had seizure. He is on keppra.   I told parents that seizure was listed as one of the side effects of Geodon although happens infrequently. His seizure may related to medication but also can be related to sleep deprivation. However, as he is now coming to hospital, will [facility]. Parents will keep us posted. I will send note to Dr. Patrecia PaceWills also.   Marvel PlanJindong Preeya Cleckley, MD PhD Stroke Neurology 12/04/2017 2:15 PM

## 2017-12-04 NOTE — ED Triage Notes (Signed)
[facility] while eating lunch. Mother aware of what was occurring and patient did not hit floor. Alert and oriented on arrival. IV established. Takes Keppra daily as directed and 3-4 days ago started on Geodon for a tic. Did not like the way he was feeling taking same and did not take it last night and did not sleep well. On arrival alert and oriented. CBG 110.

## 2017-12-04 NOTE — ED Provider Notes (Signed)
[facility] EMERGENCY DEPARTMENT Provider Note   CSN: [id number] Arrival date & time: 12/04/17  1424     History   Chief Complaint Chief Complaint  Patient presents with  . Seizures    HPI Luis Bailey is a 22 y.o. male.  HPI Patient presents after seizure.  This is his third seizure.  Had one 18 months ago one 3 months ago and another one today.  Witnessed by family.  Patient turned purple and had tonic-clonic activity.  No loss of bladder control.  He did not sleep much at all last night.  He is on 500 mg of Keppra twice a day.  He did however start Geodon.  He had 2 days of it and then that was not handling side effects well and stopped it yesterday.  Denies substance abuse.  He has had some mood changes with the Keppra.  Patient's family discussed with neurology and was told to come into the ER.  States he has been compliant with the Keppra.  Had a negative MRI and EEG has had some nonspecific changes. Past Medical History:  Diagnosis Date  . Anxiety   . Depression   . High cholesterol   . Hypertension   . Seizure disorder (HCC) 10/26/2017  . Seizures [facility])     Patient Active Problem List   Diagnosis Date Noted  . Seizure disorder (HCC) 10/26/2017  . 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 Obsessive compulsive disorder 08/09/2014  . Clinical depression 03/27/2014    Past Surgical History:  Procedure Laterality Date  . CIRCUMCISION    . MOLE REMOVAL    . WISDOM TOOTH EXTRACTION         Home Medications    Prior to Admission medications   Medication Sig Start Date End Date Taking? Authorizing Provider  acetaminophen (TYLENOL) 500 MG tablet Take 1,000 mg by mouth daily as needed for headache.    Yes [provider]  ASPIRIN PO Take 650 mg by mouth daily as needed (HEADACHE).    Yes [provider]  cloNIDine (CATAPRES) 0.3 MG tablet Take 0.3 mg by mouth at bedtime.   Yes [provider]  FLUoxetine (PROZAC) 40 MG capsule Take 80 mg by mouth at bedtime.   Yes [provider]  ibuprofen (ADVIL,MOTRIN) 200 MG tablet Take 600 mg by mouth daily as needed for headache.    Yes [provider]  MAGNESIUM PO Take 400 mg by mouth at bedtime.  08/29/15  Yes [provider]  OVER THE COUNTER MEDICATION Take 1,400 mg by mouth daily.   Yes [provider]  divalproex (DEPAKOTE) 250 MG DR tablet Take 3 tablets (750 mg total) by mouth 2 (two) times daily. 12/04/17   Benjiman Core, MD    Family History Family History  Problem Relation Age of Onset  . Diabetes type II Maternal Grandmother   . High blood pressure Maternal Grandmother   . High Cholesterol Maternal Grandmother   . Diabetes type II Maternal Grandfather   . Myasthenia gravis Maternal Grandfather   . Melanoma Maternal Grandfather        died at age 20  . Diabetes type II Paternal Grandmother   . High blood pressure Paternal Grandmother   . High Cholesterol Paternal Grandmother     Social History Social History   Tobacco Use  . Smoking status: Never Smoker  . Smokeless tobacco: Never Used  Substance Use Topics  .  Alcohol use: Yes    Alcohol/week: 0.6 oz    Types: 1 Shots of liquor per week    Comment: occaisional   . Drug use: No     Allergies   Carrot [daucus carota]   Review of Systems Review of Systems  Constitutional: Negative for fatigue.  HENT: Negative for congestion.   Respiratory: Negative for shortness of breath.   Cardiovascular: Negative for chest pain.  Gastrointestinal: Negative for abdominal pain.  Genitourinary: Negative for flank pain.  Musculoskeletal: Negative for back pain.  Skin: Negative for rash.  Neurological: Positive for seizures.       Patient has a tic  Hematological: Negative for adenopathy.  Psychiatric/Behavioral: Negative for confusion.     Physical Exam Updated Vital Signs BP (!) 147/65   Pulse (!) 154   Temp  99.2 F (37.3 C) (Oral)   Resp (!) 35   Ht 6\' 3"  (1.905 m)   Wt 122.5 kg (270 lb)   SpO2 100%   BMI 33.75 kg/m   Physical Exam  Constitutional: He is oriented to person, place, and time. He appears well-developed.  HENT:  Head: Atraumatic.  Eyes: Pupils are equal, round, and reactive to light.  Neck: Neck supple.  Cardiovascular:  Mild tachycardia  Pulmonary/Chest: Effort normal.  Abdominal: There is no tenderness.  Musculoskeletal: He exhibits no edema.  Neurological: He is alert and oriented to person, place, and time.  occasional tics and cough.  Skin: Skin is warm. Capillary refill takes less than 2 seconds.     ED Treatments / Results  Labs (all labs ordered are listed, but only abnormal results are displayed) Labs Reviewed  CBG MONITORING, ED - Abnormal; Notable for the following components:      Result Value   Glucose-Capillary 102 (*)    All other components within normal limits  I-STAT CHEM 8, ED - Abnormal; Notable for the following components:   Glucose, Bld 111 (*)    All other components within normal limits    EKG  EKG Interpretation  Date/Time:  Saturday December 04 2017 14:53:54 EST Ventricular Rate:  122 PR Interval:    QRS Duration: 89 QT Interval:  314 QTC Calculation: 448 R Axis:   72 Text Interpretation:  Sinus tachycardia Probable left atrial enlargement Borderline T wave abnormalities Confirmed by Benjiman CorePickering, Santa Abdelrahman [phone]([id number]) on 12/04/2017 3:05:04 PM       Radiology No results found.  Procedures Procedures (including critical care time)  Medications Ordered in ED Medications  valproate (DEPACON) 1,000 mg in dextrose 5 % 50 mL IVPB (0 mg Intravenous Stopped 12/04/17 1826)     Initial Impression / Assessment and Plan / ED Course  I have reviewed the triage vital signs and the nursing notes.  Pertinent labs & imaging results that were available during my care of the patient were reviewed by me and considered in my medical decision making  (see chart for details).     Patient with seizure.  History of same.  Reviewed records.  Discussed with Dr. Amada JupiterKirkpatrick and Keppra changed to Depakote.  Had had some emotional problems with the Keppra.  Discharge home. Final Clinical Impressions(s) / ED Diagnoses   Final diagnoses:  Seizure [facility](HCC)    ED Discharge Orders        Ordered    divalproex (DEPAKOTE) 250 MG DR tablet  2 times daily     12/04/17 1732       Benjiman CorePickering, Tersea Aulds, MD 12/05/17 [phone]

## 2017-12-04 NOTE — ED Notes (Signed)
Patient is alert and orientedx4.  Patient was explained discharge instructions and they understood them with no questions.  The patient's parents are taking him home.

## 2017-12-05 NOTE — Telephone Encounter (Signed)
I called the patient.  The patient had another seizure.  He was just placed on Geodon, he did not tolerate the medication as it was making him jittery.  He went off the drug after 2 doses, on the third night he did not sleep well, had a seizure the next day.  The patient was taken off of Keppra and switched to Depakote in the emergency room.  I will get a revisit set up in about 2 weeks, we will check blood work at that time.

## 2017-12-06 ENCOUNTER — Telehealth: Payer: Self-pay | Admitting: *Deleted

## 2017-12-06 NOTE — Telephone Encounter (Signed)
Called, LVM for pt on cell to call and make 2 week f/u per CW,MD request.  I tried home number. Spoke with father who had me speak with pt mother to make appt. Made appt for 12/13/17 at 3:30pm. Advised them to bring updated insurance cards, and med list. She verbalized understanding and appreciation for call.

## 2017-12-13 ENCOUNTER — Ambulatory Visit: Payer: BLUE CROSS/BLUE SHIELD | Admitting: Neurology

## 2017-12-13 ENCOUNTER — Encounter: Payer: Self-pay | Admitting: Neurology

## 2017-12-13 VITALS — BP 132/81 | HR 85 | Ht 75.0 in | Wt 269.5 lb

## 2017-12-13 DIAGNOSIS — Z5181 Encounter for therapeutic drug level monitoring: Secondary | ICD-10-CM

## 2017-12-13 MED ORDER — DIVALPROEX SODIUM ER 500 MG PO TB24: ORAL_TABLET | ORAL | 3 refills | Status: DC

## 2017-12-13 NOTE — Progress Notes (Signed)
Reason for visit: Seizures  Luis Bailey is an 22 y.o. male  History of present illness:  Luis Bailey is a 22 year old left-handed white male with a history of depression and anxiety, he has a motor tic disorder and a seizure disorder.  The patient was just seen again in the emergency room on 04 December 2017 with a recurrent seizure.  He had been on Geodon for a couple days but could not tolerate the medication, he stopped the medication at that time.  The patient began having difficulty with what sounds like a manic episode, he did not sleep the night prior to the seizure.  When he went to the emergency room, the urine drug screen was negative.  They took him off of Keppra and started Depakote, he currently is on 750 mg twice daily.  He is having some drowsiness on the medication.  He is also having some tremors.  He returns to the office today for an evaluation.   Past Medical History:  Diagnosis Date  . Anxiety   . Depression   . High cholesterol   . Hypertension   . Seizure disorder (HCC) 10/26/2017  . Seizures (HCC)     Past Surgical History:  Procedure Laterality Date  . CIRCUMCISION    . MOLE REMOVAL    . WISDOM TOOTH EXTRACTION      Family History  Problem Relation Age of Onset  . Diabetes type II Maternal Grandmother   . High blood pressure Maternal Grandmother   . High Cholesterol Maternal Grandmother   . Diabetes type II Maternal Grandfather   . Myasthenia gravis Maternal Grandfather   . Melanoma Maternal Grandfather        died at age 21  . Diabetes type II Paternal Grandmother   . High blood pressure Paternal Grandmother   . High Cholesterol Paternal Grandmother     Social history:  reports that  has never smoked. he has never used smokeless tobacco. He reports that he drinks about 0.6 oz of alcohol per week. He reports that he does not use drugs.    Allergies  Allergen Reactions  . Carrot [Daucus Carota] Anaphylaxis    RAW CARROTS CAUSE THROAT TO ITCH       Medications:  Prior to Admission medications   Medication Sig Start Date End Date Taking? Authorizing Provider  acetaminophen (TYLENOL) 325 MG tablet Take 650 mg by mouth as needed.   Yes [provider]  acetaminophen (TYLENOL) 500 MG tablet Take 1,000 mg by mouth as needed for headache.    Yes [provider]  cloNIDine (CATAPRES) 0.3 MG tablet Take 0.3 mg by mouth at bedtime.   Yes [provider]  divalproex (DEPAKOTE) 250 MG DR tablet Take 3 tablets (750 mg total) by mouth 2 (two) times daily. 12/04/17  Yes Benjiman CorePickering, Nathan, MD  FLUoxetine (PROZAC) 40 MG capsule Take 80 mg by mouth at bedtime.   Yes [provider]  ibuprofen (ADVIL,MOTRIN) 200 MG tablet Take 600 mg by mouth as needed for headache.    Yes [provider]  MAGNESIUM PO Take 400 mg by mouth at bedtime.  08/29/15  Yes [provider]  OVER THE COUNTER MEDICATION Take 1,400 mg by mouth daily.   Yes [provider]    ROS:  Out of a complete 14 system review of symptoms, the patient complains only of the following symptoms, and all other reviewed systems are negative.  Seizures, tremors Daytime sleepiness  Blood  pressure 132/81, pulse 85, height 6\' 3"  (1.905 m), weight 269 lb 8 oz (122.2 kg).  Physical Exam  General: The patient is alert and cooperative at the time of the examination.  Skin: No significant peripheral edema is noted.   Neurologic Exam  Mental status: The patient is alert and oriented x 3 at the time of the examination. The patient has apparent normal recent and remote memory, with an apparently normal attention span and concentration ability.   Cranial nerves: Facial symmetry is present. Speech is normal, no aphasia or dysarthria is noted. Extraocular movements are full. Visual fields are full.  Motor: The patient has good strength in all 4 extremities.  Sensory examination: Soft touch sensation is symmetric on the face, arms, and  legs.  Coordination: The patient has good finger-nose-finger and heel-to-shin bilaterally.  Gait and station: The patient has a normal gait. Tandem gait is normal. Romberg is negative. No drift is seen.  Reflexes: Deep tendon reflexes are symmetric.   MRI brain 12/02/17:  IMPRESSION:   Normal MRI brain (with and without). Incidental mucosal thickening in the left sphenoid, left ethmoid and left frontal sinuses.  * MRI scan images were reviewed online. I agree with the written report.    Assessment/Plan:  1.  Seizure disorder with recent recurrence  The patient is now on Depakote.  We will continue on the medication, I will convert him to the extended release tablet 500 mg taking 1 in the morning and 2 in the evening.  The patient will have blood work done today.  He will follow-up for his next scheduled revisit.  If the excessive daytime drowsiness continues, a sleep study may need to be done in the future.  Marlan Palau MD 12/13/2017 3:35 PM  [facility] [address] [place], [place] [address]  Phone [phone] Fax [phone]

## 2017-12-14 ENCOUNTER — Telehealth: Payer: Self-pay | Admitting: *Deleted

## 2017-12-14 LAB — VALPROIC ACID LEVEL: Valproic Acid Lvl: 84 ug/mL (ref 50–100)

## 2017-12-14 NOTE — Telephone Encounter (Signed)
[place] and [place] to let him know depakote level in therapeutic range, no change in dosing. He verbalized understanding.

## 2017-12-14 NOTE — Telephone Encounter (Signed)
-----   Message from York Spanielharles K Willis, MD sent at 12/14/2017 12:57 PM EDT ----- Depakote level is therapeutic, no change in dosing. Please call the patient. ----- Message ----- From: Nell RangeInterface, [facility] Lab Results In Sent: 12/14/2017   7:42 AM To: York Spanielharles K Willis, MD

## 2017-12-15 ENCOUNTER — Encounter: Payer: Self-pay | Admitting: Neurology

## 2017-12-15 ENCOUNTER — Encounter: Payer: Self-pay | Admitting: *Deleted

## 2018-01-10 ENCOUNTER — Ambulatory Visit: Payer: Self-pay | Admitting: Neurology

## 2018-03-07 ENCOUNTER — Encounter: Payer: Self-pay | Admitting: Adult Health

## 2018-03-07 ENCOUNTER — Ambulatory Visit: Payer: BLUE CROSS/BLUE SHIELD | Admitting: Adult Health

## 2018-03-07 VITALS — BP 138/66 | HR 84 | Ht 75.0 in | Wt 274.8 lb

## 2018-03-07 DIAGNOSIS — Z5181 Encounter for therapeutic drug level monitoring: Secondary | ICD-10-CM | POA: Diagnosis not present

## 2018-03-07 DIAGNOSIS — R569 Unspecified convulsions: Secondary | ICD-10-CM

## 2018-03-07 NOTE — Progress Notes (Signed)
I have read the note, and I agree with the clinical assessment and plan.  Charles K Willis   

## 2018-03-07 NOTE — Progress Notes (Signed)
PATIENT: Luis Bailey DOB: 10/12/1995  REASON FOR VISIT: follow up HISTORY FROM: patient  HISTORY OF PRESENT ILLNESS: Today 03/07/18: Luis Bailey is a 22 year old male with a history of seizure.  He returns today for follow-up.  He remains on Depakote extended release 500 mg in the morning and at thousand milligrams in the evening.  He reports that he has not had any additional seizure events.  He does report low energy on Depakote as well as a tremor in the hands.  He states that he has been driving occasionally.  His last seizure was December 04, 2017.  He returns today for evaluation.  HISTORY. Luis Bailey is a 22 year old left-handed white male with a history of depression and anxiety, he has a motor tic disorder and a seizure disorder.  The patient was just seen again in the emergency room on 04 December 2017 with a recurrent seizure.  He had been on Geodon for a couple days but could not tolerate the medication, he stopped the medication at that time.  The patient began having difficulty with what sounds like a manic episode, he did not sleep the night prior to the seizure.  When he went to the emergency room, the urine drug screen was negative.  They took him off of Keppra and started Depakote, he currently is on 750 mg twice daily.  He is having some drowsiness on the medication.  He is also having some tremors.  He returns to the office today for an evaluation.      REVIEW OF SYSTEMS: Out of a complete 14 system review of symptoms, the patient complains only of the following symptoms, and all other reviewed systems are negative.  See HPI  ALLERGIES: Allergies  Allergen Reactions  . Carrot [Daucus Carota] Anaphylaxis    RAW CARROTS CAUSE THROAT TO [facility]     HOME MEDICATIONS: Outpatient Medications Prior to Visit  Medication Sig Dispense Refill  . acetaminophen (TYLENOL) 500 MG tablet Take 1,000 mg by mouth as needed for headache.     Marland Kitchen aspirin 325 MG tablet Take 325 mg by mouth as  needed.     . cloNIDine (CATAPRES) 0.3 MG tablet Take 0.3 mg by mouth at bedtime.    . divalproex (DEPAKOTE ER) 500 MG 24 hr tablet One tablet in the morning and 2 in the evening 90 tablet 3  . FLUoxetine (PROZAC) 40 MG capsule Take 80 mg by mouth at bedtime.    Marland Kitchen ibuprofen (ADVIL,MOTRIN) 200 MG tablet Take 600 mg by mouth as needed for headache.     Marland Kitchen MAGNESIUM PO Take 400 mg by mouth at bedtime.     Marland Kitchen OVER THE COUNTER MEDICATION Take 1,400 mg by mouth daily.     No facility-administered medications prior to visit.     PAST MEDICAL HISTORY: Past Medical History:  Diagnosis Date  . Anxiety   . Depression   . High cholesterol   . Hypertension   . Seizure disorder (HCC) 10/26/2017  . Seizures (HCC)     PAST SURGICAL HISTORY: Past Surgical History:  Procedure Laterality Date  . CIRCUMCISION    . MOLE REMOVAL    . WISDOM TOOTH EXTRACTION      FAMILY HISTORY: Family History  Problem Relation Age of Onset  . Diabetes type II Maternal Grandmother   . High blood pressure Maternal Grandmother   . High Cholesterol Maternal Grandmother   . Diabetes type II Maternal Grandfather   . Myasthenia gravis Maternal Grandfather   .  Melanoma Maternal Grandfather        died at age 8  . Diabetes type II Paternal Grandmother   . High blood pressure Paternal Grandmother   . High Cholesterol Paternal Grandmother     SOCIAL HISTORY: Social History   Socioeconomic History  . Marital status: Married    Spouse name: Not on file  . Number of children: Not on file  . Years of education: Not on file  . Highest education level: Not on file  Occupational History  . Not on file  Social Needs  . Financial resource strain: Not on file  . Food insecurity:    Worry: Not on file    Inability: Not on file  . Transportation needs:    Medical: Not on file    Non-medical: Not on file  Tobacco Use  . Smoking status: Never Smoker  . Smokeless tobacco: Never Used  Substance and Sexual Activity    . Alcohol use: Yes    Alcohol/week: 0.6 oz    Types: 1 Shots of liquor per week    Comment: occaisional   . Drug use: No  . Sexual activity: Never  Lifestyle  . Physical activity:    Days per week: Not on file    Minutes per session: Not on file  . Stress: Not on file  Relationships  . Social connections:    Talks on phone: Not on file    Gets together: Not on file    Attends religious service: Not on file    Active member of club or organization: Not on file    Attends meetings of clubs or organizations: Not on file    Relationship status: Not on file  . Intimate partner violence:    Fear of current or ex partner: Not on file    Emotionally abused: Not on file    Physically abused: Not on file    Forced sexual activity: Not on file  Other Topics Concern  . Not on file  Social History Narrative   Luis Bailey is a 22 yo male.   He does not attend school.   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   He enjoys running the [employer], shopping, eating and getting pedicures.      PHYSICAL EXAM  Vitals:   03/07/18 1321  BP: 138/66  Pulse: 84  SpO2: 97%  Weight: 274 lb 12.8 oz (124.6 kg)  Height: 6\' 3"  (1.905 m)   Body mass index is 34.35 kg/m.  Generalized: Well developed, in no acute distress   Neurological examination  Mentation: Alert oriented to time, place, history taking. Follows all commands speech and language fluent Cranial nerve II-XII: Pupils were equal round reactive to light. Extraocular movements were full, visual field were full on confrontational test. Facial sensation and strength were normal. Uvula tongue midline. Head turning and shoulder shrug  were normal and symmetric. Motor: The motor testing reveals 5 over 5 strength of all 4 extremities. Good symmetric motor tone is noted throughout.  Sensory: Sensory testing is intact to soft touch on all 4 extremities. No evidence of extinction is noted.  Coordination: Cerebellar testing reveals good  finger-nose-finger and heel-to-shin bilaterally.  Gait and station: Gait is normal.  Reflexes: Deep tendon reflexes are symmetric and normal bilaterally.   DIAGNOSTIC DATA (LABS, IMAGING, TESTING) - I reviewed patient records, labs, notes, testing and imaging myself where available.  Lab Results  Component Value Date   WBC 7.3 10/19/2017   HGB  16.0 12/04/2017   HCT 47.0 12/04/2017   MCV 85.6 10/19/2017   PLT 192 10/19/2017      Component Value Date/Time   NA 140 12/04/2017 1545   K 4.3 12/04/2017 1545   CL 104 12/04/2017 1545   CO2 20 (L) 10/19/2017 1506   GLUCOSE 111 (H) 12/04/2017 1545   BUN 16 12/04/2017 1545   CREATININE 0.90 12/04/2017 1545   CALCIUM 9.3 10/19/2017 1506   PROT 7.3 10/19/2017 1506   ALBUMIN 4.4 10/19/2017 1506   AST 18 10/19/2017 1506   ALT 24 10/19/2017 1506   ALKPHOS 45 10/19/2017 1506   BILITOT 0.5 10/19/2017 1506   GFRNONAA >60 10/19/2017 1506   GFRAA >60 10/19/2017 1506       ASSESSMENT AND PLAN 22 y.o. year old male  has a past medical history of Anxiety, Depression, High cholesterol, Hypertension, Seizure disorder (HCC) (10/26/2017), and Seizures (HCC). here with:  1.  Seizures  Overall the patient has been doing well.  He will continue on Depakote extended release 500 mg in the morning and 1000 milligrams in the evening.  I will check blood work today.  We discussed potentially changing his medication if the side effects did not resolve or become manageable.  Patient was also advised that he should not operate a motor vehicle until he is seizure-free for 6 months.  He will follow-up in 6 months or sooner if needed.   I spent 15 minutes with the patient. 50% of this time was spent discussing seizure precautions   Butch PennyMegan Akia Desroches, MSN, NP-C 03/07/2018, 1:32 PM [facility] [address], [address] [place], [place] [address] [phone]

## 2018-03-07 NOTE — Patient Instructions (Signed)
Your Plan:  Continue Depakote  Blood work today If your symptoms worsen or you develop new symptoms please let us know.   Thank you for coming to see us at [facility]. I hope we have been able to provide you high quality care today.  You may receive a patient satisfaction survey over the next few weeks. We would appreciate your feedback and comments so that we may continue to improve ourselves and the health of our patients.  

## 2018-03-08 LAB — COMPREHENSIVE METABOLIC PANEL
ALK PHOS: 46 IU/L (ref 39–117)
ALT: 19 IU/L (ref 0–44)
AST: 11 IU/L (ref 0–40)
Albumin/Globulin Ratio: 1.6 (ref 1.2–2.2)
Albumin: 4.6 g/dL (ref 3.5–5.5)
BUN/Creatinine Ratio: 10 (ref 9–20)
BUN: 10 mg/dL (ref 6–20)
Bilirubin Total: 0.5 mg/dL (ref 0.0–1.2)
CALCIUM: 10 mg/dL (ref 8.7–10.2)
CO2: 23 mmol/L (ref 20–29)
CREATININE: 0.96 mg/dL (ref 0.76–1.27)
Chloride: 103 mmol/L (ref 96–106)
GFR calc Af Amer: 131 mL/min/{1.73_m2} (ref >59)
GFR, EST NON AFRICAN AMERICAN: 113 mL/min/{1.73_m2} (ref >59)
GLUCOSE: 81 mg/dL (ref 65–99)
Globulin, Total: 2.8 g/dL (ref 1.5–4.5)
Potassium: 5.2 mmol/L (ref 3.5–5.2)
SODIUM: 142 mmol/L (ref 134–144)
Total Protein: 7.4 g/dL (ref 6.0–8.5)

## 2018-03-08 LAB — AMMONIA: Ammonia: 52 ug/dL (ref 27–102)

## 2018-03-08 LAB — CBC WITH DIFFERENTIAL/PLATELET
BASOS: 1 %
Basophils Absolute: 0 10*3/uL (ref 0.0–0.2)
EOS (ABSOLUTE): 0.1 10*3/uL (ref 0.0–0.4)
EOS: 3 %
HEMATOCRIT: 46.3 % (ref 37.5–51.0)
Hemoglobin: 15 g/dL (ref 13.0–17.7)
IMMATURE GRANS (ABS): 0 10*3/uL (ref 0.0–0.1)
Immature Granulocytes: 0 %
LYMPHS: 34 %
Lymphocytes Absolute: 1.4 10*3/uL (ref 0.7–3.1)
MCH: 28.6 pg (ref 26.6–33.0)
MCHC: 32.4 g/dL (ref 31.5–35.7)
MCV: 88 fL (ref 79–97)
MONOCYTES: 10 %
MONOS ABS: 0.4 10*3/uL (ref 0.1–0.9)
NEUTROS PCT: 52 %
Neutrophils Absolute: 2.1 10*3/uL (ref 1.4–7.0)
Platelets: 195 10*3/uL (ref 150–450)
RBC: 5.24 x10E6/uL (ref 4.14–5.80)
RDW: 15.7 % — AB (ref 12.3–15.4)
WBC: 4.1 10*3/uL (ref 3.4–10.8)

## 2018-03-08 LAB — VALPROIC ACID LEVEL: VALPROIC ACID LVL: 55 ug/mL (ref 50–100)

## 2018-03-09 ENCOUNTER — Telehealth: Payer: Self-pay | Admitting: *Deleted

## 2018-03-09 NOTE — Telephone Encounter (Signed)
I spoke to pt and relayed that the lab results were unremarkable, can remain on depakote as prescribed unless he wanted to switch.  Pt will stay on depakote at this time and will call us back as needed. He verbalized understanding.

## 2018-03-09 NOTE — Telephone Encounter (Signed)
-----   Message from Butch PennyMegan Millikan, NP sent at 03/09/2018  7:38 AM EDT ----- Lab work is unremarkable. Can remain on Depakote unless he wants to switch d/t SE.

## 2018-04-08 ENCOUNTER — Other Ambulatory Visit: Payer: Self-pay | Admitting: Neurology

## 2018-10-03 ENCOUNTER — Encounter: Payer: Self-pay | Admitting: Adult Health

## 2018-10-03 ENCOUNTER — Ambulatory Visit: Payer: BLUE CROSS/BLUE SHIELD | Admitting: Adult Health

## 2018-10-03 VITALS — BP 148/80 | HR 88 | Ht 75.0 in | Wt 277.6 lb

## 2018-10-03 DIAGNOSIS — R569 Unspecified convulsions: Secondary | ICD-10-CM

## 2018-10-03 MED ORDER — DIVALPROEX SODIUM ER 500 MG PO TB24: ORAL_TABLET | ORAL | 3 refills | Status: DC

## 2018-10-03 NOTE — Patient Instructions (Signed)
Your Plan:  Continue Depakote  Blood work today If your symptoms worsen or you develop new symptoms please let us know.   Thank you for coming to see us at [facility]. I hope we have been able to provide you high quality care today.  You may receive a patient satisfaction survey over the next few weeks. We would appreciate your feedback and comments so that we may continue to improve ourselves and the health of our patients.  

## 2018-10-03 NOTE — Progress Notes (Signed)
PATIENT: Luis Bailey DOB: December 12, 1995  REASON FOR VISIT: follow up HISTORY FROM: patient  HISTORY OF PRESENT ILLNESS: Today 10/03/18: [name] is a 23 year old male with a history of seizures.  He returns today for follow-up.  He continues on Depakote extended release 500 mg in the morning and 1000 mg in the evening.  He denies any seizures.  He continues to have a mild tremor in the hands.  He does not feel that this has gotten worse.  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  Able to complete all ADLs independently.  He returns today for evaluation.  HISTORY06/10/19: [name] is a 23 year old male with a history of seizure.  He returns today for follow-up.  He remains on Depakote extended release 500 mg in the morning and at thousand milligrams in the evening.  He reports that he has not had any additional seizure events.  He does report low energy on Depakote as well as a tremor in the hands.  He states that he has been driving occasionally.  His last seizure was December 04, 2017.  He returns today for evaluation.    REVIEW OF SYSTEMS: Out of a complete 14 system review of symptoms, the patient complains only of the following symptoms, and all other reviewed systems are negative.  Diarrhea, tremors, agitation, neck stiffness, snoring, light sensitivity, blurred vision  ALLERGIES: Allergies  Allergen Reactions  . Carrot [Daucus Carota] Anaphylaxis    RAW CARROTS CAUSE THROAT TO [facility]     HOME MEDICATIONS: Outpatient Medications Prior to Visit  Medication Sig Dispense Refill  . acetaminophen (TYLENOL) 500 MG tablet Take 1,000 mg by mouth as needed for headache.     Marland Kitchen aspirin 325 MG tablet Take 325 mg by mouth as needed.     . cloNIDine (CATAPRES) 0.3 MG tablet Take 0.3 mg by mouth at bedtime.    . divalproex (DEPAKOTE ER) 500 MG 24 hr tablet TAKE 1 TABLET BY MOUTH IN THE MORNING AND 2 IN THE EVENING 270 tablet 2  . FLUoxetine (PROZAC) 40 MG capsule Take 80 mg by mouth  at bedtime.    Marland Kitchen ibuprofen (ADVIL,MOTRIN) 200 MG tablet Take 600 mg by mouth as needed for headache.     Marland Kitchen MAGNESIUM PO Take 400 mg by mouth at bedtime.     Marland Kitchen OVER THE COUNTER MEDICATION Take 1,400 mg by mouth daily.     No facility-administered medications prior to visit.     PAST MEDICAL HISTORY: Past Medical History:  Diagnosis Date  . Anxiety   . Depression   . High cholesterol   . Hypertension   . Seizure disorder (HCC) 10/26/2017  . Seizures (HCC)     PAST SURGICAL HISTORY: Past Surgical History:  Procedure Laterality Date  . CIRCUMCISION    . MOLE REMOVAL    . WISDOM TOOTH EXTRACTION      FAMILY HISTORY: Family History  Problem Relation Age of Onset  . Diabetes type II Maternal Grandmother   . High blood pressure Maternal Grandmother   . High Cholesterol Maternal Grandmother   . Diabetes type II Maternal Grandfather   . Myasthenia gravis Maternal Grandfather   . Melanoma Maternal Grandfather        died at age 13  . Diabetes type II Paternal Grandmother   . High blood pressure Paternal Grandmother   . High Cholesterol Paternal Grandmother     SOCIAL HISTORY: Social History   Socioeconomic History  . Marital status: Married  Spouse name: Not on file  . Number of children: Not on file  . Years of education: Not on file  . Highest education level: Not on file  Occupational History  . Not on file  Social Needs  . Financial resource strain: Not on file  . Food insecurity:    Worry: Not on file    Inability: Not on file  . Transportation needs:    Medical: Not on file    Non-medical: Not on file  Tobacco Use  . Smoking status: Never Smoker  . Smokeless tobacco: Never Used  Substance and Sexual Activity  . Alcohol use: Yes    Alcohol/week: 1.0 standard drinks    Types: 1 Shots of liquor per week    Comment: occaisional   . Drug use: No  . Sexual activity: Never  Lifestyle  . Physical activity:    Days per week: Not on file    Minutes per  session: Not on file  . Stress: Not on file  Relationships  . Social connections:    Talks on phone: Not on file    Gets together: Not on file    Attends religious service: Not on file    Active member of club or organization: Not on file    Attends meetings of clubs or organizations: Not on file    Relationship status: Not on file  . Intimate partner violence:    Fear of current or ex partner: Not on file    Emotionally abused: Not on file    Physically abused: Not on file    Forced sexual activity: Not on file  Other Topics Concern  . Not on file  Social History Narrative   Luis Bailey is a 23 yo male.   He does not attend school.   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   He enjoys running the [employer], shopping, eating and getting pedicures.      PHYSICAL EXAM  Vitals:   10/03/18 1348  BP: (!) 148/80  Pulse: 88  Weight: 277 lb 9.6 oz (125.9 kg)  Height: 6\' 3"  (1.905 m)   Body mass index is 34.7 kg/m.  Generalized: Well developed, in no acute distress   Neurological examination  Mentation: Alert oriented to time, place, history taking. Follows all commands speech and language fluent Cranial nerve II-XII: Pupils were equal round reactive to light. Extraocular movements were full, visual field were full on confrontational test. Facial sensation and strength were normal. Uvula tongue midline. Head turning and shoulder shrug  were normal and symmetric. Motor: The motor testing reveals 5 over 5 strength of all 4 extremities. Good symmetric motor tone is noted throughout.  Sensory: Sensory testing is intact to soft touch on all 4 extremities. No evidence of extinction is noted.  Coordination: Cerebellar testing reveals good finger-nose-finger and heel-to-shin bilaterally.  Gait and station: Gait is normal. Tandem gait is normal. Romberg is negative. No drift is seen.  Reflexes: Deep tendon reflexes are symmetric and normal bilaterally.   DIAGNOSTIC DATA (LABS,  IMAGING, TESTING) - I reviewed patient records, labs, notes, testing and imaging myself where available.  Lab Results  Component Value Date   WBC 4.1 03/07/2018   HGB 15.0 03/07/2018   HCT 46.3 03/07/2018   MCV 88 03/07/2018   PLT 195 03/07/2018      Component Value Date/Time   NA 142 03/07/2018 1414   K 5.2 03/07/2018 1414   CL 103 03/07/2018 1414   CO2 23  03/07/2018 1414   GLUCOSE 81 03/07/2018 1414   GLUCOSE 111 (H) 12/04/2017 1545   BUN 10 03/07/2018 1414   CREATININE 0.96 03/07/2018 1414   CALCIUM 10.0 03/07/2018 1414   PROT 7.4 03/07/2018 1414   ALBUMIN 4.6 03/07/2018 1414   AST 11 03/07/2018 1414   ALT 19 03/07/2018 1414   ALKPHOS 46 03/07/2018 1414   BILITOT 0.5 03/07/2018 1414   GFRNONAA 113 03/07/2018 1414   GFRAA 131 03/07/2018 1414     ASSESSMENT AND PLAN 23 y.o. year old male  has a past medical history of Anxiety, Depression, High cholesterol, Hypertension, Seizure disorder (HCC) (10/26/2017), and Seizures (HCC). here with:  1. Seizures   Overall the patient is doing well.  He will continue on Depakote.  I will check blood work today.  He is advised if his symptoms worsen or he develops new symptoms he should let us know.  He will follow-up in 6 months or sooner if needed.   I spent 15 minutes with the patient. 50% of this time was spent reviewing plan of care   Butch PennyMegan Webber Michiels, MSN, NP-C 10/03/2018, 2:15 PM [facility] [address], [address] [place], [place] [address] [phone]

## 2018-10-03 NOTE — Progress Notes (Signed)
I have read the note, and I agree with the clinical assessment and plan.  Luis Bailey   

## 2018-10-04 ENCOUNTER — Encounter: Payer: Self-pay | Admitting: *Deleted

## 2018-10-04 LAB — CBC WITH DIFFERENTIAL/PLATELET
BASOS: 2 %
Basophils Absolute: 0.1 10*3/uL (ref 0.0–0.2)
EOS (ABSOLUTE): 0.3 10*3/uL (ref 0.0–0.4)
EOS: 9 %
HEMATOCRIT: 44.3 % (ref 37.5–51.0)
Hemoglobin: 15.3 g/dL (ref 13.0–17.7)
IMMATURE GRANULOCYTES: 0 %
Immature Grans (Abs): 0 10*3/uL (ref 0.0–0.1)
LYMPHS: 32 %
Lymphocytes Absolute: 1.2 10*3/uL (ref 0.7–3.1)
MCH: 29.9 pg (ref 26.6–33.0)
MCHC: 34.5 g/dL (ref 31.5–35.7)
MCV: 87 fL (ref 79–97)
MONOS ABS: 0.4 10*3/uL (ref 0.1–0.9)
Monocytes: 9 %
NEUTROS ABS: 1.8 10*3/uL (ref 1.4–7.0)
NEUTROS PCT: 48 %
Platelets: 192 10*3/uL (ref 150–450)
RBC: 5.11 x10E6/uL (ref 4.14–5.80)
RDW: 13.2 % (ref 11.6–15.4)
WBC: 3.7 10*3/uL (ref 3.4–10.8)

## 2018-10-04 LAB — COMPREHENSIVE METABOLIC PANEL
ALBUMIN: 4.6 g/dL (ref 3.5–5.5)
ALK PHOS: 49 IU/L (ref 39–117)
ALT: 19 IU/L (ref 0–44)
AST: 14 IU/L (ref 0–40)
Albumin/Globulin Ratio: 1.6 (ref 1.2–2.2)
BUN / CREAT RATIO: 14 (ref 9–20)
BUN: 13 mg/dL (ref 6–20)
Bilirubin Total: 0.3 mg/dL (ref 0.0–1.2)
CO2: 22 mmol/L (ref 20–29)
CREATININE: 0.92 mg/dL (ref 0.76–1.27)
Calcium: 10 mg/dL (ref 8.7–10.2)
Chloride: 102 mmol/L (ref 96–106)
GFR calc Af Amer: 137 mL/min/{1.73_m2} (ref >59)
GFR calc non Af Amer: 118 mL/min/{1.73_m2} (ref >59)
GLUCOSE: 80 mg/dL (ref 65–99)
Globulin, Total: 2.9 g/dL (ref 1.5–4.5)
Potassium: 4.5 mmol/L (ref 3.5–5.2)
Sodium: 142 mmol/L (ref 134–144)
Total Protein: 7.5 g/dL (ref 6.0–8.5)

## 2018-10-04 LAB — AMMONIA: Ammonia: 36 ug/dL (ref 27–102)

## 2018-10-04 LAB — VALPROIC ACID LEVEL: Valproic Acid Lvl: 66 ug/mL (ref 50–100)

## 2019-05-08 IMAGING — CT CT HEAD W/O CM
3 series · 15 of 47 positions shown, 18 images · non-contrast
Comparison: None.

CLINICAL DATA: Witness seizure lasting less than 1 minute with
subsequent fall injuring head. Laceration left periorbital region.

EXAM:
CT HEAD WITHOUT CONTRAST
TECHNIQUE: Contiguous axial images were obtained from the base of the skull
through the vertex without intravenous contrast.

[Series 2: head wo · axial · 0.45mm/px · z∈[-124,+6]mm · 9 of 32 slices shown, 12 images]
[im 3/32  brain]
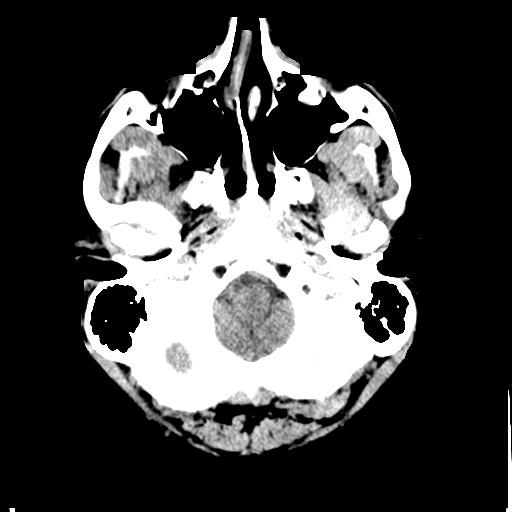
[im 3/32  bone]
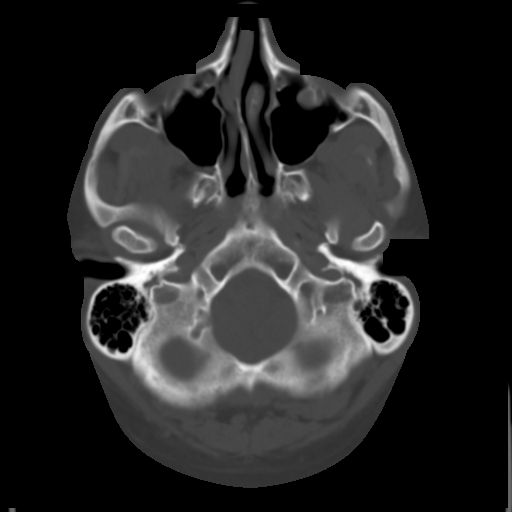
[im 6/32  brain]
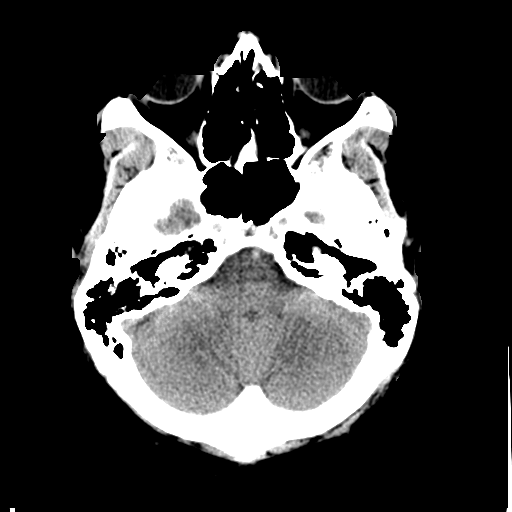
[im 9/32  brain]
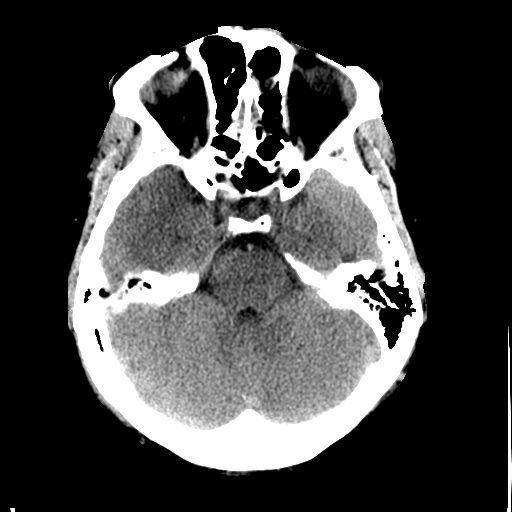
[im 12/32  brain]
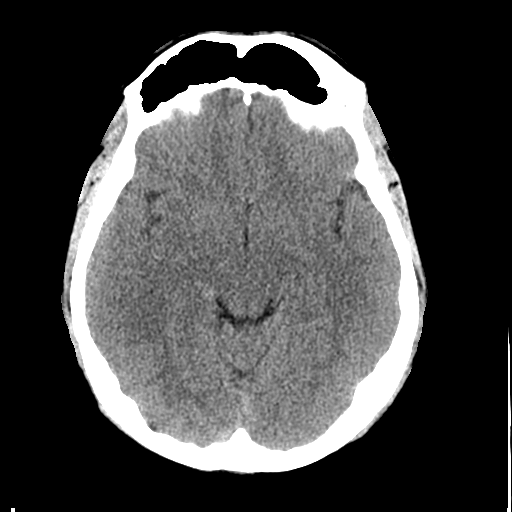
[im 17/32  brain]
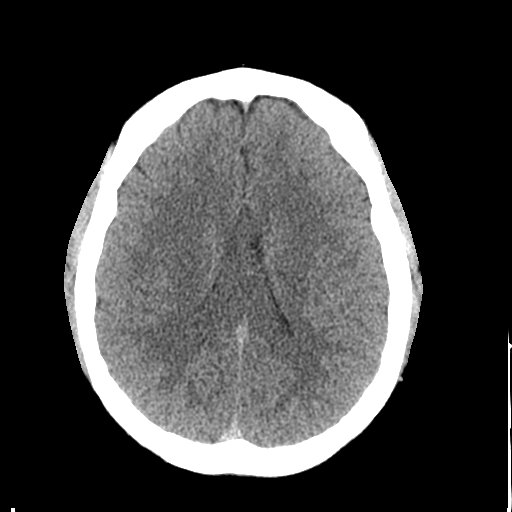
[im 17/32  bone]
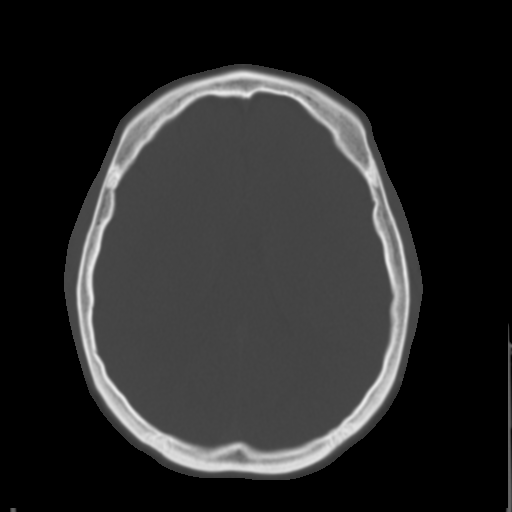
[im 20/32  brain]
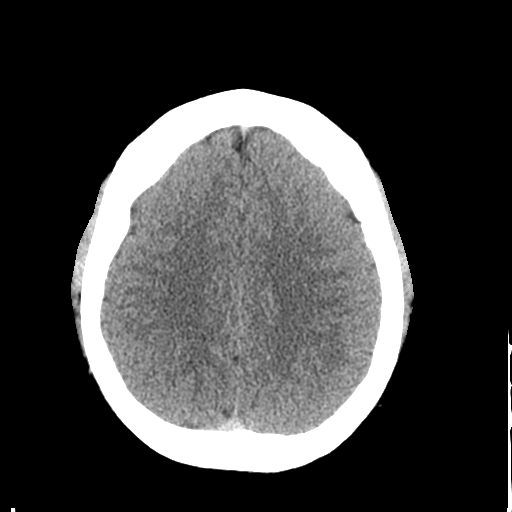
[im 23/32  brain]
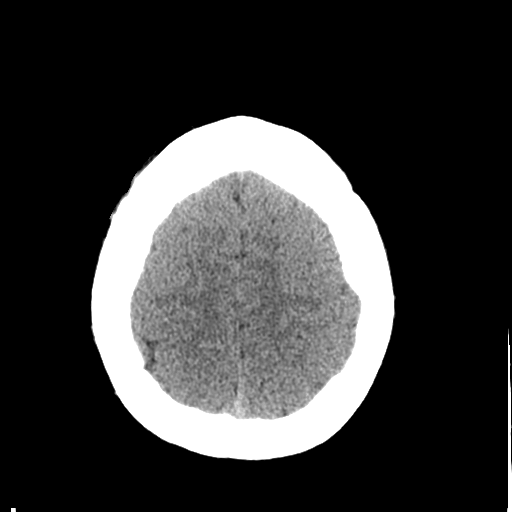
[im 26/32  brain]
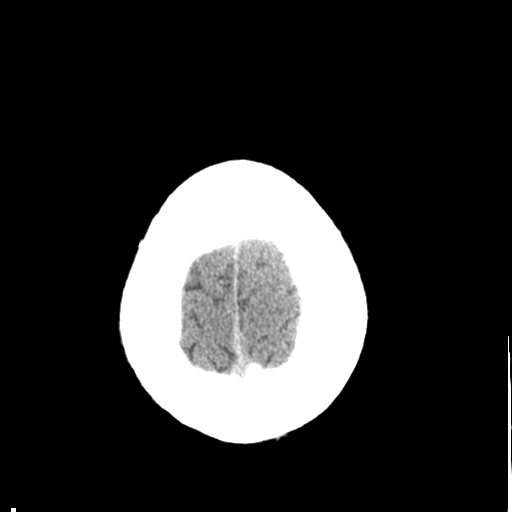
[im 29/32  brain]
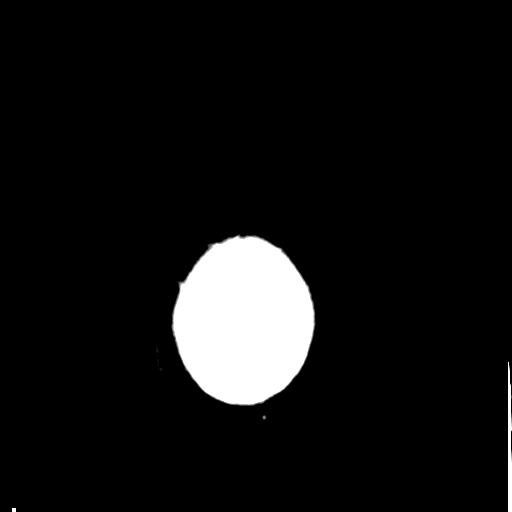
[im 29/32  bone]
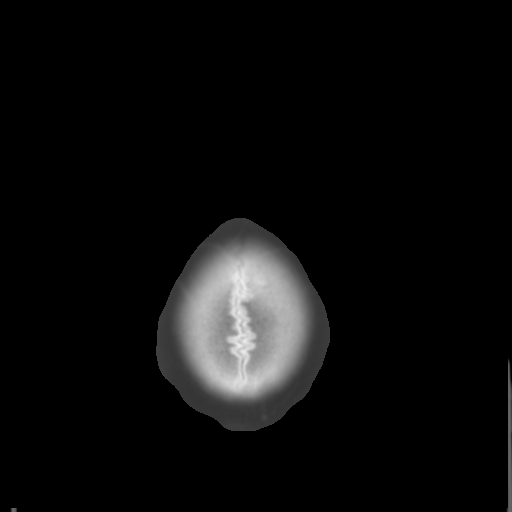

[Series 5: coronal soft tissue · coronal · 0.31mm/px · 3 of 70 slices shown]
[im 24/70  brain]
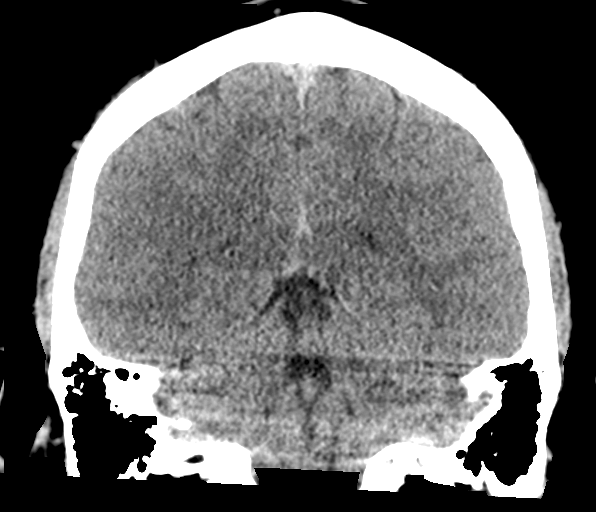
[im 31/70  brain]
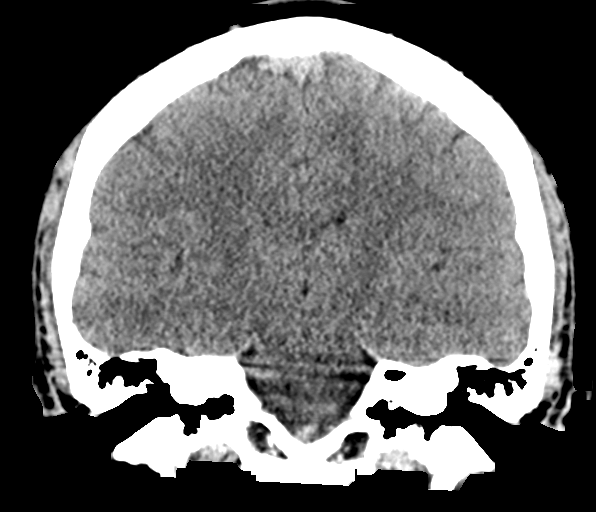
[im 39/70  brain]
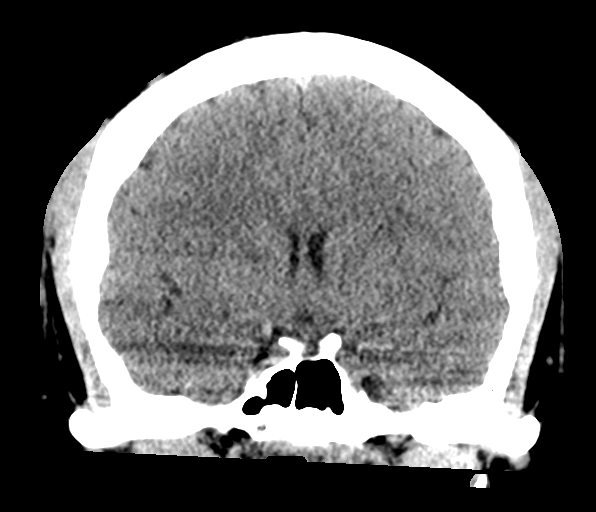

[Series 6: sagittal soft tissue · sagittal · 0.31mm/px · 3 of 60 slices shown]
[im 20/60  brain]
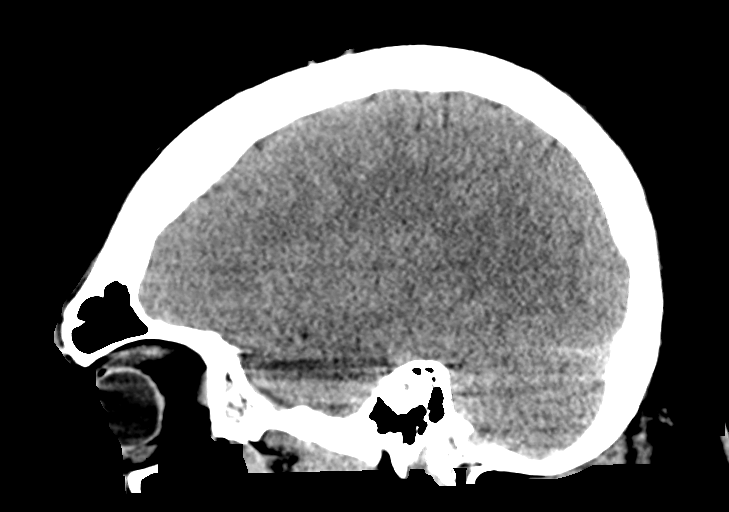
[im 30/60  brain]
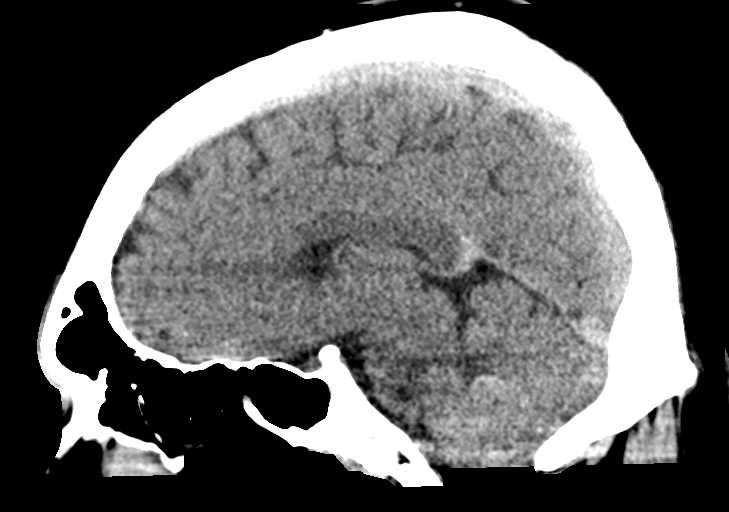
[im 40/60  brain]
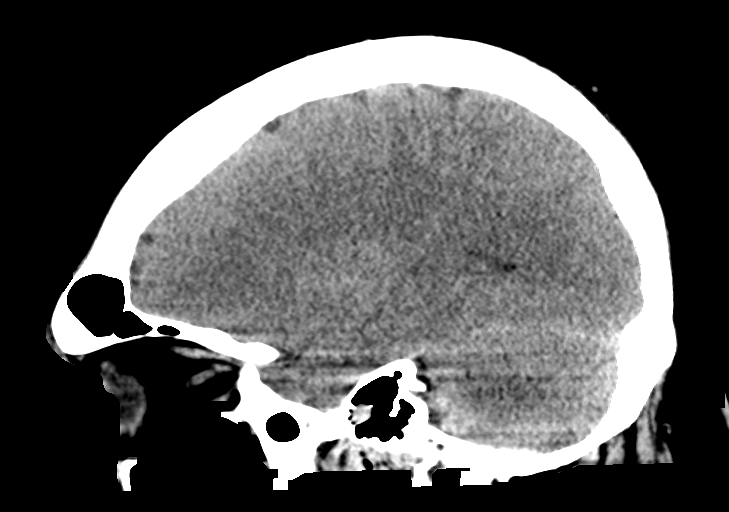

[15 of 47 positions shown; findings below may reference images not displayed]

FINDINGS: Brain: Ventricles, cisterns and other CSF spaces are normal. There
is no mass, mass effect, shift of midline structures or acute
hemorrhage. No evidence of acute infarction.

Vascular: No hyperdense vessel or unexpected calcification.

Skull: Normal. Negative for fracture or focal lesion.

Sinuses/Orbits: Small mucous retention cyst anterior wall left
maxillary sinus. Mild opacification over the left frontal ethmoidal
recess. No air-fluid levels. Mastoid air cells are clear. Orbits are
normal.

Other: None.
IMPRESSION: Normal head CT.

Minimal chronic sinus inflammatory change.

## 2019-10-03 ENCOUNTER — Telehealth: Payer: Self-pay | Admitting: Adult Health

## 2019-10-03 NOTE — Telephone Encounter (Signed)
lvm to r/s 1/11 appt due to NP being out  

## 2019-10-09 ENCOUNTER — Ambulatory Visit: Payer: BLUE CROSS/BLUE SHIELD | Admitting: Adult Health

## 2019-11-27 ENCOUNTER — Encounter: Payer: Self-pay | Admitting: Adult Health

## 2019-11-27 ENCOUNTER — Other Ambulatory Visit: Payer: Self-pay

## 2019-11-27 ENCOUNTER — Ambulatory Visit: Payer: BC Managed Care – PPO | Admitting: Adult Health

## 2019-11-27 VITALS — BP 122/72 | HR 92 | Temp 97.8°F | Ht 75.0 in | Wt 299.8 lb

## 2019-11-27 DIAGNOSIS — Z5181 Encounter for therapeutic drug level monitoring: Secondary | ICD-10-CM

## 2019-11-27 DIAGNOSIS — R569 Unspecified convulsions: Secondary | ICD-10-CM | POA: Diagnosis not present

## 2019-11-27 NOTE — Progress Notes (Signed)
PATIENT: Luis Bailey DOB: [birth year]  REASON FOR VISIT: follow up HISTORY FROM: patient  HISTORY OF PRESENT ILLNESS: Today 11/27/19:  Luis Bailey is a 24 year old male with a history of seizures.  He returns today for follow-up.  Remains on Depakote extended release 500 mg in the morning and at 1000 mg in the evening.  No seizure events.  He operates a Teacher, music.  Has a mild tremor in the hands.  Reports that he has been having GI upset.  He reports that he has a family history of diverticulitis.  He was curious if his GI upset could be from Depakote.  However he does not recall if he had any change in his GI when he started the medication.  He does note that in the last 3 to 4 months he has had 3-4 watery stools after dinnertime.  He returns today for an evaluation.  HISTORY 10/03/18: Luis Bailey is a 24 year old male with a history of seizures.  He returns today for follow-up.  He continues on Depakote extended release 500 mg in the morning and 1000 mg in the evening.  He denies any seizures.  He continues to have a mild tremor in the hands.  He does not feel that this has gotten worse.  He operates a Teacher, music without difficulty.  Able to complete all ADLs independently.  He returns today for evaluation.  REVIEW OF SYSTEMS: Out of a complete 14 system review of symptoms, the patient complains only of the following symptoms, and all other reviewed systems are negative.  See HPI  ALLERGIES: Allergies  Allergen Reactions  . Carrot [Daucus Carota] Anaphylaxis    RAW CARROTS CAUSE THROAT TO [facility]     HOME MEDICATIONS: Outpatient Medications Prior to Visit  Medication Sig Dispense Refill  . cloNIDine (CATAPRES) 0.3 MG tablet Take 0.3 mg by mouth at bedtime.    . divalproex (DEPAKOTE ER) 500 MG 24 hr tablet TAKE 1 TABLET BY MOUTH IN THE MORNING AND 2 IN THE EVENING 270 tablet 3  . FLUoxetine (PROZAC) 40 MG capsule Take 80 mg by mouth at bedtime.    Marland Kitchen ibuprofen (ADVIL,MOTRIN)  200 MG tablet Take 600 mg by mouth as needed for headache.     Marland Kitchen acetaminophen (TYLENOL) 500 MG tablet Take 1,000 mg by mouth as needed for headache.     Marland Kitchen aspirin 325 MG tablet Take 325 mg by mouth as needed.     Marland Kitchen MAGNESIUM PO Take 400 mg by mouth at bedtime.     Marland Kitchen OVER THE COUNTER MEDICATION Take 1,400 mg by mouth daily.     No facility-administered medications prior to visit.    PAST MEDICAL HISTORY: Past Medical History:  Diagnosis Date  . Anxiety   . Depression   . High cholesterol   . Hypertension   . Seizure disorder ([place]) 10/26/2017  . Seizures ([place])     PAST SURGICAL HISTORY: Past Surgical History:  Procedure Laterality Date  . CIRCUMCISION    . MOLE REMOVAL    . WISDOM TOOTH EXTRACTION      FAMILY HISTORY: Family History  Problem Relation Age of Onset  . Diabetes type II Maternal Grandmother   . High blood pressure Maternal Grandmother   . High Cholesterol Maternal Grandmother   . Diabetes type II Maternal Grandfather   . Myasthenia gravis Maternal Grandfather   . Melanoma Maternal Grandfather        died at age 44  . Diabetes type  II Paternal Grandmother   . High blood pressure Paternal Grandmother   . High Cholesterol Paternal Grandmother     SOCIAL HISTORY: Social History   Socioeconomic History  . Marital status: Married    Spouse name: Not on file  . Number of children: Not on file  . Years of education: Not on file  . Highest education level: Not on file  Occupational History  . Not on file  Tobacco Use  . Smoking status: Never Smoker  . Smokeless tobacco: Never Used  Substance and Sexual Activity  . Alcohol use: Yes    Alcohol/week: 1.0 standard drinks    Types: 1 Shots of liquor per week    Comment: occaisional   . Drug use: No  . Sexual activity: Never  Other Topics Concern  . Not on file  Social History Narrative   Luis Bailey is a 24 yo male.   He does not attend school.   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   He enjoys  running the [employer], shopping, eating and getting pedicures.   Social Determinants of Health   Financial Resource Strain:   . Difficulty of Paying Living Expenses: Not on file  Food Insecurity:   . Worried About Programme researcher, broadcasting/film/video in the Last Year: Not on file  . Ran Out of Food in the Last Year: Not on file  Transportation Needs:   . Lack of Transportation (Medical): Not on file  . Lack of Transportation (Non-Medical): Not on file  Physical Activity:   . Days of Exercise per Week: Not on file  . Minutes of Exercise per Session: Not on file  Stress:   . Feeling of Stress : Not on file  Social Connections:   . Frequency of Communication with Friends and Family: Not on file  . Frequency of Social Gatherings with Friends and Family: Not on file  . Attends Religious Services: Not on file  . Active Member of Clubs or Organizations: Not on file  . Attends Banker Meetings: Not on file  . Marital Status: Not on file  Intimate Partner Violence:   . Fear of Current or Ex-Partner: Not on file  . Emotionally Abused: Not on file  . Physically Abused: Not on file  . Sexually Abused: Not on file      PHYSICAL EXAM  Vitals:   11/27/19 1128  BP: 122/72  Pulse: 92  Temp: 97.8 F (36.6 C)  TempSrc: Oral  Weight: 299 lb 12.8 oz (136 kg)  Height: 6\' 3"  (1.905 m)   Body mass index is 37.47 kg/m.  Generalized: Well developed, in no acute distress   Neurological examination  Mentation: Alert oriented to time, place, history taking. Follows all commands speech and language fluent Cranial nerve II-XII: Pupils were equal round reactive to light. Extraocular movements were full, visual field were full on confrontational test. Head turning and shoulder shrug  were normal and symmetric. Motor: The motor testing reveals 5 over 5 strength of all 4 extremities. Good symmetric motor tone is noted throughout.  Sensory: Sensory testing is intact to soft touch on all 4 extremities.  No evidence of extinction is noted.  Coordination: Cerebellar testing reveals good finger-nose-finger and heel-to-shin bilaterally.  Gait and station: Gait is normal.  Reflexes: Deep tendon reflexes are symmetric and normal bilaterally.   DIAGNOSTIC DATA (LABS, IMAGING, TESTING) - I reviewed patient records, labs, notes, testing and imaging myself where available.  Lab Results  Component Value  Date   WBC 3.7 10/03/2018   HGB 15.3 10/03/2018   HCT 44.3 10/03/2018   MCV 87 10/03/2018   PLT 192 10/03/2018      Component Value Date/Time   NA 142 10/03/2018 1444   K 4.5 10/03/2018 1444   CL 102 10/03/2018 1444   CO2 22 10/03/2018 1444   GLUCOSE 80 10/03/2018 1444   GLUCOSE 111 (H) 12/04/2017 1545   BUN 13 10/03/2018 1444   CREATININE 0.92 10/03/2018 1444   CALCIUM 10.0 10/03/2018 1444   PROT 7.5 10/03/2018 1444   ALBUMIN 4.6 10/03/2018 1444   AST 14 10/03/2018 1444   ALT 19 10/03/2018 1444   ALKPHOS 49 10/03/2018 1444   BILITOT 0.3 10/03/2018 1444   GFRNONAA 118 10/03/2018 1444   GFRAA 137 10/03/2018 1444      ASSESSMENT AND PLAN 25 y.o. year old male  has a past medical history of Anxiety, Depression, High cholesterol, Hypertension, Seizure disorder (HCC) (10/26/2017), and Seizures (HCC). here with:  1.  Seizures  -Continue Depakote 500 mg in the morning and 1000 mg in the evening. -Blood work today -Discussed GI upset with PCP -Advised if he has any seizure events he should let us know. -Follow-up in 1 year or sooner if needed   I spent 15 minutes with the patient. 50% of this time was spent reviewing chart discussing plan of care and seizure precautions   [name], MSN, NP-C 11/27/2019, 11:39 AM [facility] [address], [address] [place], [place] [address] [phone]

## 2019-11-27 NOTE — Patient Instructions (Addendum)
Continue Depakote- Blood work today If you have any seizure events please let us know.   

## 2019-11-27 NOTE — Progress Notes (Signed)
I have read the note, and I agree with the clinical assessment and plan.  Luis Bailey K Eloni Darius   

## 2019-11-28 LAB — CBC WITH DIFFERENTIAL/PLATELET
Basophils Absolute: 0.1 10*3/uL (ref 0.0–0.2)
Basos: 2 %
EOS (ABSOLUTE): 0.1 10*3/uL (ref 0.0–0.4)
Eos: 4 %
Hematocrit: 43.2 % (ref 37.5–51.0)
Hemoglobin: 15 g/dL (ref 13.0–17.7)
Immature Grans (Abs): 0 10*3/uL (ref 0.0–0.1)
Immature Granulocytes: 0 %
Lymphocytes Absolute: 1.5 10*3/uL (ref 0.7–3.1)
Lymphs: 43 %
MCH: 29.9 pg (ref 26.6–33.0)
MCHC: 34.7 g/dL (ref 31.5–35.7)
MCV: 86 fL (ref 79–97)
Monocytes Absolute: 0.4 10*3/uL (ref 0.1–0.9)
Monocytes: 11 %
Neutrophils Absolute: 1.4 10*3/uL (ref 1.4–7.0)
Neutrophils: 40 %
Platelets: 209 10*3/uL (ref 150–450)
RBC: 5.02 x10E6/uL (ref 4.14–5.80)
RDW: 13.4 % (ref 11.6–15.4)
WBC: 3.4 10*3/uL (ref 3.4–10.8)

## 2019-11-28 LAB — COMPREHENSIVE METABOLIC PANEL
ALT: 52 IU/L — ABNORMAL HIGH (ref 0–44)
AST: 26 IU/L (ref 0–40)
Albumin/Globulin Ratio: 1.8 (ref 1.2–2.2)
Albumin: 4.7 g/dL (ref 4.1–5.2)
Alkaline Phosphatase: 54 IU/L (ref 39–117)
BUN/Creatinine Ratio: 11 (ref 9–20)
BUN: 10 mg/dL (ref 6–20)
Bilirubin Total: 0.3 mg/dL (ref 0.0–1.2)
CO2: 20 mmol/L (ref 20–29)
Calcium: 9.8 mg/dL (ref 8.7–10.2)
Chloride: 101 mmol/L (ref 96–106)
Creatinine, Ser: 0.87 mg/dL (ref 0.76–1.27)
GFR calc Af Amer: 142 mL/min/{1.73_m2} (ref >59)
GFR calc non Af Amer: 123 mL/min/{1.73_m2} (ref >59)
Globulin, Total: 2.6 g/dL (ref 1.5–4.5)
Glucose: 88 mg/dL (ref 65–99)
Potassium: 4.3 mmol/L (ref 3.5–5.2)
Sodium: 141 mmol/L (ref 134–144)
Total Protein: 7.3 g/dL (ref 6.0–8.5)

## 2019-11-28 LAB — VALPROIC ACID LEVEL: Valproic Acid Lvl: 67 ug/mL (ref 50–100)

## 2019-11-29 ENCOUNTER — Telehealth: Payer: Self-pay

## 2019-11-29 NOTE — Telephone Encounter (Signed)
-----   Message from [name], NP sent at 11/29/2019  4:23 PM EST ----- Lab work is relatively unremarkable.  ALT is slightly elevated we will continue to monitor

## 2019-11-29 NOTE — Telephone Encounter (Signed)
Spoke with the patient and he verbalized understanding his results. No questions or concerns at this time.   

## 2019-11-30 ENCOUNTER — Other Ambulatory Visit: Payer: Self-pay | Admitting: Adult Health

## 2020-06-11 ENCOUNTER — Other Ambulatory Visit: Payer: Self-pay

## 2020-06-11 ENCOUNTER — Ambulatory Visit
Admission: EM | Admit: 2020-06-11 | Discharge: 2020-06-11 | Disposition: A | Discharge status: Home / Self Care | Payer: BC Managed Care – PPO | Attending: Physician Assistant | Admitting: Physician Assistant

## 2020-06-11 DIAGNOSIS — Z113 Encounter for screening for infections with a predominantly sexual mode of transmission: Secondary | ICD-10-CM | POA: Diagnosis present

## 2020-06-11 DIAGNOSIS — N4889 Other specified disorders of penis: Secondary | ICD-10-CM | POA: Insufficient documentation

## 2020-06-11 NOTE — Discharge Instructions (Signed)
Cytology sent, you will be contacted with any positive results that requires further treatment. Refrain from sexual activity until testing results return. If developing testicular swelling/pain, penile lesion/sore, follow up for reevaluation.

## 2020-06-11 NOTE — ED Triage Notes (Signed)
Pt presents with complaints of itching on the inside of his penis x 2 days and pain on the tip of his penis. Reports pain has subsided. Reports he has open relationship with boyfriend and reports neither of them has seen someone else recently in the last couple weeks. Reports he would like to have full std check completed.

## 2020-06-11 NOTE — ED Provider Notes (Signed)
[facility]    CSN: [id number] Arrival date & time: 06/11/20  1642      History   Chief Complaint Chief Complaint  Patient presents with  . SEXUALLY TRANSMITTED DISEASE    HPI Luis Bailey is a 24 y.o. male.   24 year old male comes in for STD testing. Had some pain to the penis that has resolved. Now with residual itching to the area x 2 days. States itching feel internal. No rash/itching to the skin of groin area.  Denies fever, abdominal pain, nausea, vomiting. Denies urinary symptoms such as frequency, dysuria, hematuria. Denies penile discharge, penile sore/ulcer, testicular swelling, testicular pain. Sexually active with 1 male partner since last STD check.     Past Medical History:  Diagnosis Date  . Anxiety   . Depression   . High cholesterol   . Hypertension   . Seizure disorder (HCC) 10/26/2017  . Seizures [facility])     Patient Active Problem List   Diagnosis Date Noted  . Seizure disorder (HCC) 10/26/2017  . 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 Obsessive compulsive disorder 08/09/2014  . Clinical depression 03/27/2014    Past Surgical History:  Procedure Laterality Date  . CIRCUMCISION    . MOLE REMOVAL    . WISDOM TOOTH EXTRACTION         Home Medications    Prior to Admission medications   Medication Sig Start Date End Date Taking? Authorizing Provider  cloNIDine (CATAPRES) 0.3 MG tablet Take 0.3 mg by mouth at bedtime.   Yes [provider]  divalproex (DEPAKOTE ER) 500 MG 24 hr tablet TAKE ONE TABLET BY MOUTH IN THE MORNING AND 2 TABLETS IN THE EVENING 11/30/19  Yes Millikan, [name], NP  FLUoxetine (PROZAC) 40 MG capsule Take 80 mg by mouth at bedtime.   Yes [provider]  ibuprofen (ADVIL,MOTRIN) 200 MG tablet Take 600 mg by mouth as needed for headache.     [provider]    Family History Family History  Problem Relation Age of Onset  . Diabetes type  II Maternal Grandmother   . High blood pressure Maternal Grandmother   . High Cholesterol Maternal Grandmother   . Diabetes type II Maternal Grandfather   . Myasthenia gravis Maternal Grandfather   . Melanoma Maternal Grandfather        died at age 44  . Diabetes type II Paternal Grandmother   . High blood pressure Paternal Grandmother   . High Cholesterol Paternal Grandmother     Social History Social History   Tobacco Use  . Smoking status: Never Smoker  . Smokeless tobacco: Never Used  Substance Use Topics  . Alcohol use: Yes    Alcohol/week: 1.0 standard drink    Types: 1 Shots of liquor per week    Comment: occaisional   . Drug use: No     Allergies   Carrot [daucus carota]   Review of Systems Review of Systems  Reason unable to perform ROS: See HPI as above.     Physical Exam Triage Vital Signs ED Triage Vitals  Enc Vitals Group     BP 06/11/20 1818 (!) 147/96     Pulse Rate 06/11/20 1818 77     Resp 06/11/20 1818 19     Temp 06/11/20 1818 98.3 F (36.8 C)     Temp src --      SpO2 06/11/20 1818 99 %  Weight --      Height --      Head Circumference --      Peak Flow --      Pain Score 06/11/20 1817 0     Pain Loc --      Pain Edu? --      Excl. in GC? --    No data found.  Updated Vital Signs BP (!) 147/96   Pulse 77   Temp 98.3 F (36.8 C)   Resp 19   SpO2 99%   Physical Exam Exam conducted with a chaperone present.  Constitutional:      General: He is not in acute distress.    Appearance: Normal appearance. He is well-developed. He is not toxic-appearing or diaphoretic.  HENT:     Head: Normocephalic and atraumatic.  Eyes:     Conjunctiva/sclera: Conjunctivae normal.     Pupils: Pupils are equal, round, and reactive to light.  Pulmonary:     Effort: Pulmonary effort is normal. No respiratory distress.  Genitourinary:    Comments: No erythema to the urethra. No rashes seen Musculoskeletal:     Cervical back: Normal range of  motion and neck supple.  Skin:    General: Skin is warm and dry.  Neurological:     Mental Status: He is alert and oriented to person, place, and time.      UC Treatments / Results  Labs (all labs ordered are listed, but only abnormal results are displayed) Labs Reviewed  HIV ANTIBODY (ROUTINE TESTING W REFLEX)  RPR  CYTOLOGY, (ORAL, ANAL, URETHRAL) ANCILLARY ONLY    EKG   Radiology No results found.  Procedures Procedures (including critical care time)  Medications Ordered in UC Medications - No data to display  Initial Impression / Assessment and Plan / UC Course  I have reviewed the triage vital signs and the nursing notes.  Pertinent labs & imaging results that were available during my care of the patient were reviewed by me and considered in my medical decision making (see chart for details).    Cytology sent, patient will be contacted with any positive results that require additional treatment. Return precautions given.   Final Clinical Impressions(s) / UC Diagnoses   Final diagnoses:  Penile irritation  Screening for STD (sexually transmitted disease)   ED Prescriptions    None     PDMP not reviewed this encounter.   Belinda Fisher, PA-C 06/12/20 1255

## 2020-06-13 ENCOUNTER — Telehealth (HOSPITAL_COMMUNITY): Payer: Self-pay | Admitting: Emergency Medicine

## 2020-06-13 LAB — HIV ANTIBODY (ROUTINE TESTING W REFLEX): HIV Screen 4th Generation wRfx: NONREACTIVE

## 2020-06-13 LAB — CYTOLOGY, (ORAL, ANAL, URETHRAL) ANCILLARY ONLY
Chlamydia: POSITIVE — AB
Comment: NEGATIVE
Comment: NEGATIVE
Comment: NORMAL
Neisseria Gonorrhea: NEGATIVE
Trichomonas: NEGATIVE

## 2020-06-13 LAB — RPR: RPR Ser Ql: NONREACTIVE

## 2020-06-13 MED ORDER — AZITHROMYCIN 250 MG PO TABS: 1000.0000 mg | ORAL_TABLET | Freq: Once | ORAL | 0 refills | Status: AC

## 2020-09-19 ENCOUNTER — Other Ambulatory Visit: Payer: Self-pay | Admitting: Adult Health

## 2020-11-27 ENCOUNTER — Encounter: Payer: Self-pay | Admitting: Adult Health

## 2020-11-27 ENCOUNTER — Ambulatory Visit: Payer: BC Managed Care – PPO | Admitting: Adult Health

## 2020-11-27 VITALS — BP 149/85 | HR 113 | Ht 76.0 in | Wt 314.0 lb

## 2020-11-27 DIAGNOSIS — R569 Unspecified convulsions: Secondary | ICD-10-CM | POA: Diagnosis not present

## 2020-11-27 DIAGNOSIS — Z5181 Encounter for therapeutic drug level monitoring: Secondary | ICD-10-CM | POA: Diagnosis not present

## 2020-11-27 NOTE — Patient Instructions (Signed)
Continue Depakote- Blood work today If you have any seizure events please let us know.   

## 2020-11-27 NOTE — Progress Notes (Signed)
I have read the note, and I agree with the clinical assessment and plan.  Charles K Willis   

## 2020-11-27 NOTE — Progress Notes (Signed)
PATIENT: Luis Bailey DOB: [birth year]  REASON FOR VISIT: follow up HISTORY FROM: patient  HISTORY OF PRESENT ILLNESS: Today 11/27/20:  Luis Bailey is a 25 year old male with a history of seizures.  He returns today for follow-up.  He is currently taking Depakote extended release 500 mg in the morning and  1000 mg at bedtime.  He denies any seizure events.  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  He is able to complete all ADLs independently.  He describes occasionally he gets these jolts that he describes as zoning out.  He had these events prior to being diagnosed with seizures.  Unsure how often they occur.  Not daily.  He returns today for follow-up  11/27/19 Luis Bailey is a 25 year old male with a history of seizures.  He returns today for follow-up.  Remains on Depakote extended release 500 mg in the morning and at 1000 mg in the evening.  No seizure events.  He operates a Librarian, academic.  Has a mild tremor in the hands.  Reports that he has been having GI upset.  He reports that he has a family history of diverticulitis.  He was curious if his GI upset could be from Depakote.  However he does not recall if he had any change in his GI when he started the medication.  He does note that in the last 3 to 4 months he has had 3-4 watery stools after dinnertime.  He returns today for an evaluation.  HISTORY 10/03/18: Luis Bailey is a 25 year old male with a history of seizures.  He returns today for follow-up.  He continues on Depakote extended release 500 mg in the morning and 1000 mg in the evening.  He denies any seizures.  He continues to have a mild tremor in the hands.  He does not feel that this has gotten worse.  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  Able to complete all ADLs independently.  He returns today for evaluation.  REVIEW OF SYSTEMS: Out of a complete 14 system review of symptoms, the patient complains only of the following symptoms, and all other reviewed  systems are negative.  See HPI  ALLERGIES: Allergies  Allergen Reactions  . Carrot [Daucus Carota] Anaphylaxis    RAW CARROTS CAUSE THROAT TO [facility]     HOME MEDICATIONS: Outpatient Medications Prior to Visit  Medication Sig Dispense Refill  . cloNIDine (CATAPRES) 0.3 MG tablet Take 0.3 mg by mouth at bedtime.    . divalproex (DEPAKOTE ER) 500 MG 24 hr tablet TAKE ONE TABLET BY MOUTH IN THE MORNING AND TWO TABLETS BY MOUTH IN THE EVENING (Patient taking differently: Take 3 tabs at mealtime) 270 tablet 1  . FLUoxetine (PROZAC) 40 MG capsule Take 80 mg by mouth at bedtime.    Marland Kitchen ibuprofen (ADVIL,MOTRIN) 200 MG tablet Take 600 mg by mouth as needed for headache.      No facility-administered medications prior to visit.    PAST MEDICAL HISTORY: Past Medical History:  Diagnosis Date  . Anxiety   . Depression   . High cholesterol   . Hypertension   . Seizure disorder (HCC) 10/26/2017  . Seizures ([facility])     PAST SURGICAL HISTORY: Past Surgical History:  Procedure Laterality Date  . CIRCUMCISION    . MOLE REMOVAL    . WISDOM TOOTH EXTRACTION      FAMILY HISTORY: Family History  Problem Relation Age of Onset  . Diabetes type II Maternal Grandmother   .  High blood pressure Maternal Grandmother   . High Cholesterol Maternal Grandmother   . Diabetes type II Maternal Grandfather   . Myasthenia gravis Maternal Grandfather   . Melanoma Maternal Grandfather        died at age 3  . Diabetes type II Paternal Grandmother   . High blood pressure Paternal Grandmother   . High Cholesterol Paternal Grandmother     SOCIAL HISTORY: Social History   Socioeconomic History  . Marital status: Single    Spouse name: Not on file  . Number of children: Not on file  . Years of education: Not on file  . Highest education level: Not on file  Occupational History  . Not on file  Tobacco Use  . Smoking status: Never Smoker  . Smokeless tobacco: Never Used  Substance and Sexual Activity   . Alcohol use: Yes    Alcohol/week: 1.0 standard drink    Types: 1 Shots of liquor per week    Comment: occaisional   . Drug use: No  . Sexual activity: Never  Other Topics Concern  . Not on file  Social History Narrative   Luis Bailey is a 25 yo male.   He does not attend school.   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   He enjoys running the [employer], shopping, eating and getting pedicures.   Social Determinants of Health   Financial Resource Strain: Not on file  Food Insecurity: Not on file  Transportation Needs: Not on file  Physical Activity: Not on file  Stress: Not on file  Social Connections: Not on file  Intimate Partner Violence: Not on file      PHYSICAL EXAM  Vitals:   11/27/20 1456  BP: (!) 149/85  Pulse: (!) 113  Weight: (!) 314 lb (142.4 kg)  Height: 6\' 4"  (1.93 m)   Body mass index is 38.22 kg/m.  Generalized: Well developed, in no acute distress   Neurological examination  Mentation: Alert oriented to time, place, history taking. Follows all commands speech and language fluent Cranial nerve II-XII: Pupils were equal round reactive to light. Extraocular movements were full, visual field were full on confrontational test. Head turning and shoulder shrug  were normal and symmetric. Motor: The motor testing reveals 5 over 5 strength of all 4 extremities. Good symmetric motor tone is noted throughout.  Sensory: Sensory testing is intact to soft touch on all 4 extremities. No evidence of extinction is noted.  Coordination: Cerebellar testing reveals good finger-nose-finger and heel-to-shin bilaterally.  Gait and station: Gait is normal.  Reflexes: Deep tendon reflexes are symmetric and normal bilaterally.   DIAGNOSTIC DATA (LABS, IMAGING, TESTING) - I reviewed patient records, labs, notes, testing and imaging myself where available.  Lab Results  Component Value Date   WBC 3.4 11/27/2019   HGB 15.0 11/27/2019   HCT 43.2 11/27/2019   MCV 86  11/27/2019   PLT 209 11/27/2019      Component Value Date/Time   NA 141 11/27/2019 1157   K 4.3 11/27/2019 1157   CL 101 11/27/2019 1157   CO2 20 11/27/2019 1157   GLUCOSE 88 11/27/2019 1157   GLUCOSE 111 (H) 12/04/2017 1545   BUN 10 11/27/2019 1157   CREATININE 0.87 11/27/2019 1157   CALCIUM 9.8 11/27/2019 1157   PROT 7.3 11/27/2019 1157   ALBUMIN 4.7 11/27/2019 1157   AST 26 11/27/2019 1157   ALT 52 (H) 11/27/2019 1157   ALKPHOS 54 11/27/2019 1157   BILITOT  0.3 11/27/2019 1157   GFRNONAA 123 11/27/2019 1157   GFRAA 142 11/27/2019 1157      ASSESSMENT AND PLAN 25 y.o. year old male  has a past medical history of Anxiety, Depression, High cholesterol, Hypertension, Seizure disorder (HCC) (10/26/2017), and Seizures ([facility]). here with:  1.  Seizures  -Continue Depakote 500 mg in the morning and 1000 mg in the evening. -Blood work today -Advised if he has any seizure events he should let us know. -Keep a record of the zoning out episodes and precipitating factors -Follow-up in 1 year or sooner if needed   I spent 25  minutes of face-to-face and non-face-to-face time with patient.  This included previsit chart review, lab review, study review, order entry, electronic health record documentation, patient education.    [name], MSN, NP-C 11/27/2020, 2:59 PM [facility] [address], [address] [place], [place] [address] [phone]

## 2020-11-28 ENCOUNTER — Telehealth: Payer: Self-pay | Admitting: *Deleted

## 2020-11-28 LAB — CBC WITH DIFFERENTIAL/PLATELET
Basophils Absolute: 0.1 10*3/uL (ref 0.0–0.2)
Basos: 2 %
EOS (ABSOLUTE): 0.2 10*3/uL (ref 0.0–0.4)
Eos: 5 %
Hematocrit: 45.6 % (ref 37.5–51.0)
Hemoglobin: 15.3 g/dL (ref 13.0–17.7)
Immature Grans (Abs): 0 10*3/uL (ref 0.0–0.1)
Immature Granulocytes: 0 %
Lymphocytes Absolute: 1.5 10*3/uL (ref 0.7–3.1)
Lymphs: 42 %
MCH: 28.7 pg (ref 26.6–33.0)
MCHC: 33.6 g/dL (ref 31.5–35.7)
MCV: 85 fL (ref 79–97)
Monocytes Absolute: 0.2 10*3/uL (ref 0.1–0.9)
Monocytes: 7 %
Neutrophils Absolute: 1.5 10*3/uL (ref 1.4–7.0)
Neutrophils: 44 %
Platelets: 212 10*3/uL (ref 150–450)
RBC: 5.34 x10E6/uL (ref 4.14–5.80)
RDW: 13.5 % (ref 11.6–15.4)
WBC: 3.4 10*3/uL (ref 3.4–10.8)

## 2020-11-28 LAB — COMPREHENSIVE METABOLIC PANEL
ALT: 47 IU/L — ABNORMAL HIGH (ref 0–44)
AST: 23 IU/L (ref 0–40)
Albumin/Globulin Ratio: 1.7 (ref 1.2–2.2)
Albumin: 4.7 g/dL (ref 4.1–5.2)
Alkaline Phosphatase: 58 IU/L (ref 44–121)
BUN/Creatinine Ratio: 13 (ref 9–20)
BUN: 10 mg/dL (ref 6–20)
Bilirubin Total: 0.3 mg/dL (ref 0.0–1.2)
CO2: 21 mmol/L (ref 20–29)
Calcium: 10.1 mg/dL (ref 8.7–10.2)
Chloride: 105 mmol/L (ref 96–106)
Creatinine, Ser: 0.76 mg/dL (ref 0.76–1.27)
Globulin, Total: 2.7 g/dL (ref 1.5–4.5)
Glucose: 161 mg/dL — ABNORMAL HIGH (ref 65–99)
Potassium: 4.5 mmol/L (ref 3.5–5.2)
Sodium: 141 mmol/L (ref 134–144)
Total Protein: 7.4 g/dL (ref 6.0–8.5)
eGFR: 130 mL/min/{1.73_m2} (ref >59)

## 2020-11-28 LAB — VALPROIC ACID LEVEL: Valproic Acid Lvl: 37 ug/mL — ABNORMAL LOW (ref 50–100)

## 2020-11-28 NOTE — Telephone Encounter (Signed)
Spoke with patient and informed him his glucose level was slightly elevated. This was not a fasting lab. ALT is mildly elevated but improvement from previous blood work. His Depakote level is slightly lower but no seizure events. He had no questions, verbalized understanding, appreciation.

## 2020-12-12 ENCOUNTER — Telehealth: Payer: Self-pay | Admitting: Adult Health

## 2020-12-12 ENCOUNTER — Other Ambulatory Visit: Payer: Self-pay | Admitting: *Deleted

## 2020-12-12 ENCOUNTER — Encounter: Payer: Self-pay | Admitting: *Deleted

## 2020-12-12 MED ORDER — DIVALPROEX SODIUM ER 500 MG PO TB24: ORAL_TABLET | ORAL | 1 refills | Status: DC

## 2020-12-12 NOTE — Telephone Encounter (Signed)
Pt is requesting a refill for divalproex (DEPAKOTE ER) 500 MG 24 hr tablet.  Pharmacy:  [facility] # [id number]

## 2021-08-08 ENCOUNTER — Telehealth: Payer: Self-pay | Admitting: Adult Health

## 2021-08-08 ENCOUNTER — Encounter: Payer: Self-pay | Admitting: Adult Health

## 2021-08-08 NOTE — Telephone Encounter (Signed)
Pt called stating that he had a seizure yesterday and is wanting to discuss with RN.

## 2021-08-11 NOTE — Telephone Encounter (Signed)
I called the pt back as he also sent a mychart message that was reviewed by MM NP. The patient has been offered a potential appt this Friday 11/18 at 10:30 AM. Asked for call back or mychart message back.

## 2021-08-15 ENCOUNTER — Encounter: Payer: Self-pay | Admitting: Adult Health

## 2021-08-15 ENCOUNTER — Ambulatory Visit: Payer: BC Managed Care – PPO | Admitting: Adult Health

## 2021-08-15 VITALS — BP 148/78 | HR 86 | Ht 76.0 in | Wt 287.0 lb

## 2021-08-15 DIAGNOSIS — Z5181 Encounter for therapeutic drug level monitoring: Secondary | ICD-10-CM

## 2021-08-15 DIAGNOSIS — R569 Unspecified convulsions: Secondary | ICD-10-CM | POA: Diagnosis not present

## 2021-08-15 NOTE — Patient Instructions (Signed)
Increase  Depakote 1000 mg twice a day Blood work today No driving for 6 months If you have any seizure events please let us know.

## 2021-08-15 NOTE — Progress Notes (Signed)
PATIENT: Luis Bailey DOB: 19-Feb-1996  REASON FOR VISIT: follow up HISTORY FROM: patient  HISTORY OF PRESENT ILLNESS: Today 08/15/21:  Mr. Luis Bailey is a 25 year old male with a history of seizures.  He returns today for follow-up.  He remains on Depakote extended release 500 mg in the morning and 1000 mg at bedtime.  He reports that he had a seizure while at work.  Previous to the seizure he was having several events where he would zone out and have jolts.  He also states that he was very sick prior to having the seizure.  Reports that he had a fever of 104 F.  Denies missing any medication.  He reports that he did lose consciousness with his seizure.  Reports that he was told that he was convulsing in all 4 extremities.  EMS was called and he was evaluated but did not go to the emergency room.  Since then he has not had any additional events.  He returns today for an evaluation.  11/27/20: Mr. Luis Bailey is a 25 year old male with a history of seizures.  He returns today for follow-up.  He is currently taking Depakote extended release 500 mg in the morning and  1000 mg at bedtime.  He denies any seizure events.  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  He is able to complete all ADLs independently.  He describes occasionally he gets these jolts that he describes as zoning out.  He had these events prior to being diagnosed with seizures.  Unsure how often they occur.  Not daily.  He returns today for follow-up  11/27/19 Mr. Luis Bailey is a 25 year old male with a history of seizures.  He returns today for follow-up.  Remains on Depakote extended release 500 mg in the morning and at 1000 mg in the evening.  No seizure events.  He operates a Librarian, academic.  Has a mild tremor in the hands.  Reports that he has been having GI upset.  He reports that he has a family history of diverticulitis.  He was curious if his GI upset could be from Depakote.  However he does not recall if he had any change in his GI  when he started the medication.  He does note that in the last 3 to 4 months he has had 3-4 watery stools after dinnertime.  He returns today for an evaluation.  HISTORY 10/03/18: Mr. Luis Bailey is a 25 year old male with a history of seizures.  He returns today for follow-up.  He continues on Depakote extended release 500 mg in the morning and 1000 mg in the evening.  He denies any seizures.  He continues to have a mild tremor in the hands.  He does not feel that this has gotten worse.  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  Able to complete all ADLs independently.  He returns today for evaluation.  REVIEW OF SYSTEMS: Out of a complete 14 system review of symptoms, the patient complains only of the following symptoms, and all other reviewed systems are negative.  See HPI  ALLERGIES: Allergies  Allergen Reactions   Carrot [Daucus Carota] Anaphylaxis    RAW CARROTS CAUSE THROAT TO [facility]     HOME MEDICATIONS: Outpatient Medications Prior to Visit  Medication Sig Dispense Refill   cloNIDine (CATAPRES) 0.3 MG tablet Take 0.3 mg by mouth at bedtime.     divalproex (DEPAKOTE ER) 500 MG 24 hr tablet TAKE ONE TABLET BY MOUTH IN THE MORNING AND TWO TABLETS  BY MOUTH IN THE EVENING (Patient taking differently: 1,500 mg every evening.) 270 tablet 1   FLUoxetine (PROZAC) 40 MG capsule Take 80 mg by mouth at bedtime.     ibuprofen (ADVIL,MOTRIN) 200 MG tablet Take 600 mg by mouth as needed for headache.      melatonin 5 MG TABS Take 5 mg by mouth.     No facility-administered medications prior to visit.    PAST MEDICAL HISTORY: Past Medical History:  Diagnosis Date   Anxiety    Depression    High cholesterol    Hypertension    Seizure disorder (HCC) 10/26/2017   Seizures (HCC)     PAST SURGICAL HISTORY: Past Surgical History:  Procedure Laterality Date   CIRCUMCISION     MOLE REMOVAL     WISDOM TOOTH EXTRACTION      FAMILY HISTORY: Family History  Problem Relation Age of Onset    Diabetes type II Maternal Grandmother    High blood pressure Maternal Grandmother    High Cholesterol Maternal Grandmother    Diabetes type II Maternal Grandfather    Myasthenia gravis Maternal Grandfather    Melanoma Maternal Grandfather        died at age 20   Diabetes type II Paternal Grandmother    High blood pressure Paternal Grandmother    High Cholesterol Paternal Grandmother     SOCIAL HISTORY: Social History   Socioeconomic History   Marital status: Single    Spouse name: Not on file   Number of children: Not on file   Years of education: Not on file   Highest education level: Not on file  Occupational History   Not on file  Tobacco Use   Smoking status: Never   Smokeless tobacco: Never  Substance and Sexual Activity   Alcohol use: Yes    Alcohol/week: 1.0 standard drink    Types: 1 Shots of liquor per week    Comment: occaisional    Drug use: No   Sexual activity: Never  Other Topics Concern   Not on file  Social History Narrative   Derrien is a 25 yo male.   He does not attend school.   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   He enjoys running the [employer], shopping, eating and getting pedicures.   Social Determinants of Health   Financial Resource Strain: Not on file  Food Insecurity: Not on file  Transportation Needs: Not on file  Physical Activity: Not on file  Stress: Not on file  Social Connections: Not on file  Intimate Partner Violence: Not on file      PHYSICAL EXAM  Vitals:   08/15/21 1013  BP: (!) 148/78  Pulse: 86  Weight: 287 lb (130.2 kg)  Height: 6\' 4"  (1.93 m)   Body mass index is 34.93 kg/m.  Generalized: Well developed, in no acute distress   Neurological examination  Mentation: Alert oriented to time, place, history taking. Follows all commands speech and language fluent Cranial nerve II-XII: Pupils were equal round reactive to light. Extraocular movements were full, visual field were full on confrontational  test. Head turning and shoulder shrug  were normal and symmetric. Motor: The motor testing reveals 5 over 5 strength of all 4 extremities. Good symmetric motor tone is noted throughout.  Sensory: Sensory testing is intact to soft touch on all 4 extremities. No evidence of extinction is noted.  Coordination: Cerebellar testing reveals good finger-nose-finger and heel-to-shin bilaterally.  Gait and station: Gait  is normal.  Reflexes: Deep tendon reflexes are symmetric and normal bilaterally.   DIAGNOSTIC DATA (LABS, IMAGING, TESTING) - I reviewed patient records, labs, notes, testing and imaging myself where available.  Lab Results  Component Value Date   WBC 3.4 11/27/2020   HGB 15.3 11/27/2020   HCT 45.6 11/27/2020   MCV 85 11/27/2020   PLT 212 11/27/2020      Component Value Date/Time   NA 141 11/27/2020 1532   K 4.5 11/27/2020 1532   CL 105 11/27/2020 1532   CO2 21 11/27/2020 1532   GLUCOSE 161 (H) 11/27/2020 1532   GLUCOSE 111 (H) 12/04/2017 1545   BUN 10 11/27/2020 1532   CREATININE 0.76 11/27/2020 1532   CALCIUM 10.1 11/27/2020 1532   PROT 7.4 11/27/2020 1532   ALBUMIN 4.7 11/27/2020 1532   AST 23 11/27/2020 1532   ALT 47 (H) 11/27/2020 1532   ALKPHOS 58 11/27/2020 1532   BILITOT 0.3 11/27/2020 1532   GFRNONAA 123 11/27/2019 1157   GFRAA 142 11/27/2019 1157      ASSESSMENT AND PLAN 25 y.o. year old male  has a past medical history of Anxiety, Depression, High cholesterol, Hypertension, Seizure disorder (HCC) (10/26/2017), and Seizures (HCC). here with:  1.  Seizures  -Increase Depakote to 1000 mg in the morning and 1000 mg in the evening. -Blood work today -Advised if he has any seizure events he should let us know. -Advised that he should not operate a motor vehicle for 6 months.  Also advised patient that he should avoid working at [employer], operating heavy machinery and water sports until seizure-free for 6 months. -Keep a record of the zoning out episodes and  precipitating factors -Follow-up in March.    [name], MSN, NP-C 08/15/2021, 10:18 AM [facility] [address] [place], [place] [address] [phone]

## 2021-08-16 LAB — CBC WITH DIFFERENTIAL/PLATELET
Basophils Absolute: 0.1 10*3/uL (ref 0.0–0.2)
Basos: 2 %
EOS (ABSOLUTE): 0.1 10*3/uL (ref 0.0–0.4)
Eos: 2 %
Hematocrit: 45.8 % (ref 37.5–51.0)
Hemoglobin: 15 g/dL (ref 13.0–17.7)
Immature Grans (Abs): 0 10*3/uL (ref 0.0–0.1)
Immature Granulocytes: 0 %
Lymphocytes Absolute: 1.7 10*3/uL (ref 0.7–3.1)
Lymphs: 50 %
MCH: 28.2 pg (ref 26.6–33.0)
MCHC: 32.8 g/dL (ref 31.5–35.7)
MCV: 86 fL (ref 79–97)
Monocytes Absolute: 0.3 10*3/uL (ref 0.1–0.9)
Monocytes: 8 %
Neutrophils Absolute: 1.3 10*3/uL — ABNORMAL LOW (ref 1.4–7.0)
Neutrophils: 38 %
Platelets: 252 10*3/uL (ref 150–450)
RBC: 5.32 x10E6/uL (ref 4.14–5.80)
RDW: 13.3 % (ref 11.6–15.4)
WBC: 3.4 10*3/uL (ref 3.4–10.8)

## 2021-08-16 LAB — COMPREHENSIVE METABOLIC PANEL
ALT: 22 IU/L (ref 0–44)
AST: 13 IU/L (ref 0–40)
Albumin/Globulin Ratio: 1.8 (ref 1.2–2.2)
Albumin: 4.7 g/dL (ref 4.1–5.2)
Alkaline Phosphatase: 56 IU/L (ref 44–121)
BUN/Creatinine Ratio: 12 (ref 9–20)
BUN: 10 mg/dL (ref 6–20)
Bilirubin Total: 0.3 mg/dL (ref 0.0–1.2)
CO2: 23 mmol/L (ref 20–29)
Calcium: 9.9 mg/dL (ref 8.7–10.2)
Chloride: 102 mmol/L (ref 96–106)
Creatinine, Ser: 0.85 mg/dL (ref 0.76–1.27)
Globulin, Total: 2.6 g/dL (ref 1.5–4.5)
Glucose: 96 mg/dL (ref 70–99)
Potassium: 5.2 mmol/L (ref 3.5–5.2)
Sodium: 141 mmol/L (ref 134–144)
Total Protein: 7.3 g/dL (ref 6.0–8.5)
eGFR: 124 mL/min/{1.73_m2} (ref >59)

## 2021-08-16 LAB — VALPROIC ACID LEVEL: Valproic Acid Lvl: 72 ug/mL (ref 50–100)

## 2021-08-19 ENCOUNTER — Telehealth: Payer: Self-pay | Admitting: Adult Health

## 2021-08-19 MED ORDER — DIVALPROEX SODIUM ER 500 MG PO TB24: 1000.0000 mg | ORAL_TABLET | Freq: Two times a day (BID) | ORAL | 1 refills | Status: DC

## 2021-08-19 NOTE — Telephone Encounter (Signed)
Pt called asking about the new dosage of his divalproex (DEPAKOTE ER) 500 MG 24 hr tablet. Pharmacy [facility] [id number], [place] - [address]

## 2021-08-19 NOTE — Telephone Encounter (Signed)
I called and LMVM that I did renew depakote 500mg  ER take 2 tabs po BID (1000mg  po BID) and was escribed to [facility].

## 2021-08-19 NOTE — Addendum Note (Signed)
Addended by: Hermenia Fiscal S on: 08/19/2021 02:00 PM   Modules accepted: Orders

## 2021-12-01 ENCOUNTER — Ambulatory Visit: Payer: Self-pay | Admitting: Adult Health

## 2021-12-12 ENCOUNTER — Emergency Department (HOSPITAL_COMMUNITY)
Admission: EM | Admit: 2021-12-12 | Discharge: 2021-12-12 | Disposition: A | Discharge status: Home / Self Care | Payer: Managed Care, Other (non HMO) | Attending: Emergency Medicine | Admitting: Emergency Medicine

## 2021-12-12 ENCOUNTER — Other Ambulatory Visit: Payer: Self-pay

## 2021-12-12 ENCOUNTER — Encounter (HOSPITAL_COMMUNITY): Payer: Self-pay | Admitting: Emergency Medicine

## 2021-12-12 DIAGNOSIS — G40909 Epilepsy, unspecified, not intractable, without status epilepticus: Secondary | ICD-10-CM | POA: Insufficient documentation

## 2021-12-12 DIAGNOSIS — R569 Unspecified convulsions: Secondary | ICD-10-CM | POA: Diagnosis present

## 2021-12-12 LAB — COMPREHENSIVE METABOLIC PANEL
ALT: 27 U/L (ref 0–44)
AST: 20 U/L (ref 15–41)
Albumin: 4.3 g/dL (ref 3.5–5.0)
Alkaline Phosphatase: 37 U/L — ABNORMAL LOW (ref 38–126)
Anion gap: 9 (ref 5–15)
BUN: 13 mg/dL (ref 6–20)
CO2: 26 mmol/L (ref 22–32)
Calcium: 9.5 mg/dL (ref 8.9–10.3)
Chloride: 103 mmol/L (ref 98–111)
Creatinine, Ser: 0.94 mg/dL (ref 0.61–1.24)
GFR, Estimated: >60 mL/min (ref >60)
Glucose, Bld: 89 mg/dL (ref 70–99)
Potassium: 4.4 mmol/L (ref 3.5–5.1)
Sodium: 138 mmol/L (ref 135–145)
Total Bilirubin: 0.4 mg/dL (ref 0.3–1.2)
Total Protein: 7.8 g/dL (ref 6.5–8.1)

## 2021-12-12 LAB — CBC WITH DIFFERENTIAL/PLATELET
Abs Immature Granulocytes: 0.03 10*3/uL (ref 0.00–0.07)
Basophils Absolute: 0.1 10*3/uL (ref 0.0–0.1)
Basophils Relative: 1 %
Eosinophils Absolute: 0.1 10*3/uL (ref 0.0–0.5)
Eosinophils Relative: 2 %
HCT: 49 % (ref 39.0–52.0)
Hemoglobin: 16.1 g/dL (ref 13.0–17.0)
Immature Granulocytes: 0 %
Lymphocytes Relative: 18 %
Lymphs Abs: 1.3 10*3/uL (ref 0.7–4.0)
MCH: 29.5 pg (ref 26.0–34.0)
MCHC: 32.9 g/dL (ref 30.0–36.0)
MCV: 89.7 fL (ref 80.0–100.0)
Monocytes Absolute: 0.5 10*3/uL (ref 0.1–1.0)
Monocytes Relative: 7 %
Neutro Abs: 5 10*3/uL (ref 1.7–7.7)
Neutrophils Relative %: 72 %
Platelets: 185 10*3/uL (ref 150–400)
RBC: 5.46 MIL/uL (ref 4.22–5.81)
RDW: 13.5 % (ref 11.5–15.5)
WBC: 7 10*3/uL (ref 4.0–10.5)
nRBC: 0 % (ref 0.0–0.2)

## 2021-12-12 LAB — RAPID URINE DRUG SCREEN, HOSP PERFORMED
Amphetamines: NOT DETECTED
Barbiturates: NOT DETECTED
Benzodiazepines: NOT DETECTED
Cocaine: NOT DETECTED
Opiates: NOT DETECTED
Tetrahydrocannabinol: NOT DETECTED

## 2021-12-12 LAB — CBG MONITORING, ED: Glucose-Capillary: 74 mg/dL (ref 70–99)

## 2021-12-12 LAB — ETHANOL: Alcohol, Ethyl (B): <10 mg/dL (ref <10)

## 2021-12-12 LAB — MAGNESIUM: Magnesium: 2.3 mg/dL (ref 1.7–2.4)

## 2021-12-12 LAB — VALPROIC ACID LEVEL: Valproic Acid Lvl: 79 ug/mL (ref 50.0–100.0)

## 2021-12-12 LAB — PHOSPHORUS: Phosphorus: 3.6 mg/dL (ref 2.5–4.6)

## 2021-12-12 NOTE — ED Triage Notes (Signed)
Patient presents post seizure at work. During the seizure the patient fell and hit his head. Patient was post ictal for 5 min.EMS reports one episode of emesis. EMS administered 4 mg of Zofran.  ? ?Hx Seizure ? ?EMS vitals: ?126/62 BP ?100's HR ?89 CBG ? ?

## 2021-12-12 NOTE — ED Provider Notes (Signed)
?[facility]-EMERGENCY DEPT ?Provider Note ? ? ?CSN: [id number] ?Arrival date & time: 12/12/21  1439 ? ?  ? ?History ? ?Chief Complaint  ?Patient presents with  ? Seizures  ? ? ?Luis Bailey is a 26 y.o. male. ? ?HPI ? ?  ?26 year old male comes in with chief complaint of seizures. ?Patient has history of seizure disorder.  He is taking Depakote. ? ?Patient indicates that he has been taking his medications as prescribed.  Last time he had a seizure was sometime in October or November, when his Depakote was adjusted. ? ?Today patient was at work and had a seizure.  He indicates that he was feeling little unwell and was having these " blackout spells", which typically is a prodrome for him prior to have a seizure.  Next thing he knows he had a seizure and people were helping him. ? ?He indicates that he was stressed at work.  He did not sleep well last night. ? ?He denies any drug use, tobacco use disorder, recent illnesses. ? ?From the seizure itself, patient denies any symptoms of severe headache or neck pain, chest pain, abdominal pain or back pain. ? ?Home Medications ?Prior to Admission medications   ?Medication Sig Start Date End Date Taking? Authorizing Provider  ?cloNIDine (CATAPRES) 0.3 MG tablet Take 0.3 mg by mouth at bedtime.   Yes [provider]  ?divalproex (DEPAKOTE ER) 500 MG 24 hr tablet Take 2 tablets (1,000 mg total) by mouth in the morning and at bedtime. 08/19/21  Yes [name], NP  ?FLUoxetine (PROZAC) 40 MG capsule Take 80 mg by mouth in the morning.   Yes [provider]  ?ibuprofen (ADVIL,MOTRIN) 200 MG tablet Take 200 mg by mouth every 6 (six) hours as needed for headache.   Yes [provider]  ?melatonin 5 MG TABS Take 5 mg by mouth at bedtime as needed (sleep).   Yes [provider]  ?MOUNJARO 7.5 MG/0.5ML Pen Inject 7.5 mg into the skin once a week. Take on Tuesdays. 12/04/21  Yes [provider]  ?   ? ?Allergies     ?Carrot [daucus carota]   ? ?Review of Systems   ?Review of Systems  ?All other systems reviewed and are negative. ? ?Physical Exam ?Updated Vital Signs ?BP 116/80   Pulse 87   Temp 97.9 ?F (36.6 ?C) (Oral)   Resp 17   SpO2 97%  ?Physical Exam ?Vitals and nursing note reviewed.  ?Constitutional:   ?   Appearance: He is well-developed.  ?HENT:  ?   Head: Atraumatic.  ?Eyes:  ?   Extraocular Movements: Extraocular movements intact.  ?   Pupils: Pupils are equal, round, and reactive to light.  ?Cardiovascular:  ?   Rate and Rhythm: Normal rate.  ?Pulmonary:  ?   Effort: Pulmonary effort is normal.  ?Musculoskeletal:  ?   Cervical back: Neck supple.  ?Skin: ?   General: Skin is warm.  ?Neurological:  ?   Mental Status: He is alert and oriented to person, place, and time.  ?   Cranial Nerves: No cranial nerve deficit.  ? ? ?ED Results / Procedures / Treatments   ?Labs ?(all labs ordered are listed, but only abnormal results are displayed) ?Labs Reviewed  ?COMPREHENSIVE METABOLIC PANEL - Abnormal; Notable for the following components:  ?    Result Value  ? Alkaline Phosphatase 37 (*)   ? All other components within normal limits  ?CBC WITH DIFFERENTIAL/PLATELET  ?MAGNESIUM  ?  PHOSPHORUS  ?ETHANOL  ?RAPID URINE DRUG SCREEN, HOSP PERFORMED  ?VALPROIC ACID LEVEL  ?CBG MONITORING, ED  ? ? ?EKG ?None ? ?Radiology ?No results found. ? ?Procedures ?Procedures  ? ? ?Medications Ordered in ED ?Medications - No data to display ? ?ED Course/ Medical Decision Making/ A&P ?  ?                        ?Medical Decision Making ?Amount and/or Complexity of Data Reviewed ?Labs: ordered. ? ? ?26 year old male comes in with chief complaint of seizure.  He has medical history of seizure disorder for which he takes Depakote ? ?Reviewed neurology note from November 2022, at which time it appears that his Depakote was adjusted.  Patient indicates no seizures since then.  Today he was having generalized weakness/unwell type feeling.  He  started zoning out and then had seizures.  He was stressed at work because they were falling behind on delivery and he also might not have slept well last couple of nights. ? ?No infection prodrome on on history. ? ?I ordered Depakote level, and it is normal.  Blood work is reassuring. ?UDS sent, metabolic profile is normal. ? ?Patient's work-up reviewed around 7 PM, all the results are reassuring. ? ?Patient was monitored in the ED for 4 hours, he did not have any repeat spell. ? ?Message sent to Everlene Other, who is the nurse practitioner managing patient's seizure at [facility], hopefully she can give patient a call and adjust his medication or reassess him. ? ?Return precautions discussed. ? ?Final Clinical Impression(s) / ED Diagnoses ?Final diagnoses:  ?Seizure (HCC)  ? ? ?Rx / DC Orders ?ED Discharge Orders   ? ? None  ? ?  ? ? ?  ?[name], MD ?12/12/21 2026 ? ?

## 2021-12-12 NOTE — Discharge Instructions (Addendum)
You are seen in the ER after he had a seizure. ? ?Your blood work is reassuring.  Your Depakote level is within normal limits. ? ?Given that you already have a neurologist, a message has been sent to them to see if they can call you and adjust your medications if needed. ? ?Return to the ER if you have repeat episode. ? ?[place] law prevents people with seizures or fainting from driving or operating dangerous machinery until they are free of seizures or fainting for 6 months. ? ?

## 2021-12-15 ENCOUNTER — Telehealth: Payer: Self-pay | Admitting: *Deleted

## 2021-12-15 NOTE — Telephone Encounter (Signed)
-----   Message from [name] Millikan, NP sent at 12/15/2021 10:14 AM EDT ----- ?Regarding: FW: follow up ?Please call and get him scheduled for an appointment this week. Can be VV ?----- Message ----- ?From: Nanavati, Ankit, MD ?Sent: 12/12/2021   6:44 PM EDT ?To: [name] Millikan, NP ?Subject: follow up                                     ? ?Hi [name], ? ?Luis Bailey had an episode of seizure while at work.  His Depakote level was reassuring. Would you be able to contact him for a follow up and adjusting medicine as needed. ? ?Sincerely, ? ?Ankit Nanavati ? ? ?

## 2021-12-15 NOTE — Telephone Encounter (Signed)
-----   Message from [name], NP sent at 12/15/2021 10:14 AM EDT ----- ?Regarding: FW: follow up ?Please call and get him scheduled for an appointment this week. Can be VV ?----- Message ----- ?From: [name], MD ?Sent: 12/12/2021   6:44 PM EDT ?To: [name], NP ?Subject: follow up                                     ? ?Hi [name], ? ?Luis Bailey had an episode of seizure while at work.  His Depakote level was reassuring. Would you be able to contact him for a follow up and adjusting medicine as needed. ? ?Sincerely, ? ?Luis Bailey ? ? ?

## 2021-12-15 NOTE — Telephone Encounter (Signed)
Called pt, VM full. Sent [employer].  ?

## 2021-12-17 ENCOUNTER — Telehealth (INDEPENDENT_AMBULATORY_CARE_PROVIDER_SITE_OTHER): Payer: Managed Care, Other (non HMO) | Admitting: Adult Health

## 2021-12-17 DIAGNOSIS — R569 Unspecified convulsions: Secondary | ICD-10-CM

## 2021-12-17 MED ORDER — DIVALPROEX SODIUM ER 500 MG PO TB24: ORAL_TABLET | ORAL | 3 refills | Status: DC

## 2021-12-17 NOTE — Progress Notes (Signed)
? ? ? ?PATIENT: Luis Bailey ?DOB: May 28, 1996 ? ?REASON FOR VISIT: follow up ?HISTORY FROM: patient ? ?Virtual Visit via Video Note ? ?I connected with Luis Bailey on 12/17/21 at  2:30 PM EDT by a video enabled telemedicine application [facility] and verified that I am speaking with the correct person using two identifiers who was located at their own home. ?  ?I discussed the limitations of evaluation and management by telemedicine and the availability of in person appointments. The patient expressed understanding and agreed to proceed. ? ? ?PATIENT: Luis Bailey ?DOB: May 28, 1996 ? ?REASON FOR VISIT: follow up ?HISTORY FROM: patient ? ?HISTORY OF PRESENT ILLNESS: ?Today 12/17/21: ? ?Luis Bailey is a 26 year old male with a history of seizures.  He returns today for follow-up.  At the last visit Depakote extended release was increased to 1000 mg twice a day.  He states that he had another seizure event.  I reviewed the EMS report.  The patient states that he was at work.  He was having several episodes where he was zoning out.  He was then told by coworkers that he fell to the floor.  Initially hitting his hip then his face.  He did have a bloody nose.  States that his coworkers told him that he was tremoring in all 4 extremities and was stiff.  No loss of bowels or bladder.  He states that when EMS came he did vomit on the gurney.  He reports that he has been under stress with work and did not sleep well the night before.  Rep[facility] that [facility].  The only other seizure medication he has tried is Keppra. ? ?HISTORY 08/15/21: ?  ?Luis Bailey is a 26 year old male with a history of seizures.  He returns today for follow-up.  He remains on Depakote extended release 500 mg in the morning and 1000 mg at bedtime.  He reports that he had a seizure while at work.  Previous to the seizure he was having several events where he would zone out and have jolts.   He also states that he was very sick prior to having the seizure.  Reports that he had a fever of 104 ?F.  Denies missing any medication.  He reports that he did lose consciousness with his seizure.  Reports that he was told that he was convulsing in all 4 extremities.  EMS was called and he was evaluated but did not go to the emergency room.  Since then he has not had any additional events.  He returns today for an evaluation. ?  ?11/27/20: Luis Bailey is a 26 year old male with a history of seizures.  He returns today for follow-up.  He is currently taking Depakote extended release 500 mg in the morning and  1000 mg at bedtime.  He denies any seizure events.  He operates a Librarian, academicmotor vehicle without difficulty.  He is able to complete all ADLs independently.  He describes occasionally he gets these jolts that he describes as zoning out.  He had these events prior to being diagnosed with seizures.  Unsure how often they occur.  Not daily.  He returns today for follow-up ?  ?11/27/19 Luis Bailey is a 26 year old male with a history of seizures.  He returns today for follow-up.  Remains on Depakote extended release 500 mg in the morning and at 1000 mg in the evening.  No seizure events.  He operates a Librarian, academicmotor vehicle.  Has a mild tremor in the hands.  Reports that he has been having GI upset.  He reports that he has a family history of diverticulitis.  He was curious if his GI upset could be from Depakote.  However he does not recall if he had any change in his GI when he started the medication.  He does note that in the last 3 to 4 months he has had 3-4 watery stools after dinnertime.  He returns today for an evaluation. ?  ?HISTORY 10/03/18: ?Luis Bailey is a 26 year old male with a history of seizures.  He returns today for follow-up.  He continues on Depakote extended release 500 mg in the morning and 1000 mg in the evening.  He denies any seizures.  He continues to have a mild tremor in the hands.  He does not feel that this  has gotten worse.  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  Able to complete all ADLs independently.  He returns today for evaluation. ? ?REVIEW OF SYSTEMS: Out of a complete 14 system review of symptoms, the patient complains only of the following symptoms, and all other reviewed systems are negative. ? ?ALLERGIES: ?Allergies  ?Allergen Reactions  ? Carrot [Daucus Carota] Anaphylaxis  ?  RAW CARROTS CAUSE THROAT TO ITCH   ? ? ?HOME MEDICATIONS: ?Outpatient Medications Prior to Visit  ?Medication Sig Dispense Refill  ? cloNIDine (CATAPRES) 0.3 MG tablet Take 0.3 mg by mouth at bedtime.    ? FLUoxetine (PROZAC) 40 MG capsule Take 80 mg by mouth in the morning.    ? ibuprofen (ADVIL,MOTRIN) 200 MG tablet Take 200 mg by mouth every 6 (six) hours as needed for headache.    ? melatonin 5 MG TABS Take 5 mg by mouth at bedtime as needed (sleep).    ? MOUNJARO 7.5 MG/0.5ML Pen Inject 7.5 mg into the skin once a week. Take on Tuesdays.    ? divalproex (DEPAKOTE ER) 500 MG 24 hr tablet Take 2 tablets (1,000 mg total) by mouth in the morning and at bedtime. 360 tablet 1  ? ?No facility-administered medications prior to visit.  ? ? ?PAST MEDICAL HISTORY: ?Past Medical History:  ?Diagnosis Date  ? Anxiety   ? Depression   ? High cholesterol   ? Hypertension   ? Seizure disorder (HCC) 10/26/2017  ? Seizures (HCC)   ? ? ?PAST SURGICAL HISTORY: ?Past Surgical History:  ?Procedure Laterality Date  ? CIRCUMCISION    ? MOLE REMOVAL    ? WISDOM TOOTH EXTRACTION    ? ? ?FAMILY HISTORY: ?Family History  ?Problem Relation Age of Onset  ? Diabetes type II Maternal Grandmother   ? High blood pressure Maternal Grandmother   ? High Cholesterol Maternal Grandmother   ? Diabetes type II Maternal Grandfather   ? Myasthenia gravis Maternal Grandfather   ? Melanoma Maternal Grandfather   ?     died at age 69  ? Diabetes type II Paternal Grandmother   ? High blood pressure Paternal Grandmother   ? High Cholesterol Paternal Grandmother    ? ? ?SOCIAL HISTORY: ?Social History  ? ?Socioeconomic History  ? Marital status: Single  ?  Spouse name: Not on file  ? Number of children: Not on file  ? Years of education: Not on file  ? Highest education level: Not on file  ?Occupational History  ? Not on file  ?Tobacco Use  ? Smoking status: Never  ? Smokeless tobacco: Never  ?Substance and Sexual  Activity  ? Alcohol use: Yes  ?  Alcohol/week: 1.0 standard drink  ?  Types: 1 Shots of liquor per week  ?  Comment: occaisional   ? Drug use: No  ? Sexual activity: Never  ?Other Topics Concern  ? Not on file  ?Social History Narrative  ? Oakley is a 26 yo male.  ? He does not attend school.  ? 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  ? He enjoys running the [employer], shopping, eating and getting pedicures.  ? ?Social Determinants of Health  ? ?Financial Resource Strain: Not on file  ?Food Insecurity: Not on file  ?Transportation Needs: Not on file  ?Physical Activity: Not on file  ?Stress: Not on file  ?Social Connections: Not on file  ?Intimate Partner Violence: Not on file  ? ? ? ? ?PHYSICAL EXAM ?Generalized: Well developed, in no acute distress  ? ?Neurological examination  ?Mentation: Alert oriented to time, place, history taking. Follows all commands speech and language fluent ?Cranial nerve II-XII:Extraocular movements were full. Facial symmetry noted. Head turning and shoulder shrug  were normal and symmetric. ? ?DIAGNOSTIC DATA (LABS, IMAGING, TESTING) ?- I reviewed patient records, labs, notes, testing and imaging myself where available. ? ?Lab Results  ?Component Value Date  ? WBC 7.0 12/12/2021  ? HGB 16.1 12/12/2021  ? HCT 49.0 12/12/2021  ? MCV 89.7 12/12/2021  ? PLT 185 12/12/2021  ? ?   ?Component Value Date/Time  ? NA 138 12/12/2021 1554  ? NA 141 08/15/2021 1029  ? K 4.4 12/12/2021 1554  ? CL 103 12/12/2021 1554  ? CO2 26 12/12/2021 1554  ? GLUCOSE 89 12/12/2021 1554  ? BUN 13 12/12/2021 1554  ? BUN 10 08/15/2021 1029  ? CREATININE 0.94  12/12/2021 1554  ? CALCIUM 9.5 12/12/2021 1554  ? PROT 7.8 12/12/2021 1554  ? PROT 7.3 08/15/2021 1029  ? ALBUMIN 4.3 12/12/2021 1554  ? ALBUMIN 4.7 08/15/2021 1029  ? AST 20 12/12/2021 1554  ? ALT 27 03/17/2

## 2022-01-14 ENCOUNTER — Ambulatory Visit: Payer: Managed Care, Other (non HMO) | Admitting: Neurology

## 2022-01-14 ENCOUNTER — Encounter: Payer: Self-pay | Admitting: Neurology

## 2022-01-14 VITALS — BP 137/87 | HR 79 | Ht 76.0 in | Wt 290.0 lb

## 2022-01-14 DIAGNOSIS — F959 Tic disorder, unspecified: Secondary | ICD-10-CM

## 2022-01-14 DIAGNOSIS — R569 Unspecified convulsions: Secondary | ICD-10-CM | POA: Diagnosis not present

## 2022-01-14 DIAGNOSIS — Z5181 Encounter for therapeutic drug level monitoring: Secondary | ICD-10-CM | POA: Diagnosis not present

## 2022-01-14 MED ORDER — VALTOCO 15 MG DOSE 7.5 MG/0.1ML NA LQPK: 7.5000 mg | NASAL | 2 refills | Status: AC | PRN

## 2022-01-14 NOTE — Patient Instructions (Signed)
Continue with Depakote 1000 mg in the morning and 1500 mg in the evening ?We will check a Depakote level, including CBC and CMP. ?Routine EEG ?Follow-up in 33-month or sooner if worse ?

## 2022-01-14 NOTE — Progress Notes (Signed)
? ?[facility] ? ?PATIENT: Luis Bailey ?DOB: 05-Sep-1996 ? ?REQUESTING CLINICIAN: Teena IraniSpencer, Sara C, PA-C ?HISTORY FROM: Patient and mother  ?REASON FOR VISIT: Seizure disorder  ? ? ?HISTORICAL ? ?CHIEF COMPLAINT:  ?Chief Complaint  ?Patient presents with  ? Follow-up  ?  ROOM 15, WITH MOTHER ?Pt is doing well and stable   ? ? ?HISTORY OF PRESENT ILLNESS:  ?History 01/14/2022 ?Luis Bailey presents today for follow-up.  He is accompanied by his mother.  He reported his last seizure was on March 17 and in the setting of lack of sleep.  Since then his Depakote has been increased to 1000 mg in the morning and 1500 mg in the evening.  He denies any additional seizures since the increase in medication.  Denies any side effect from the medication, no other complaints.   ? ?Today 12/17/21: ?Luis Bailey is a 26 year old male with a history of seizures.  He returns today for follow-up.  At the last visit Depakote extended release was increased to 1000 mg twice a day.  He states that he had another seizure event.  I reviewed the EMS report.  The patient states that he was at work.  He was having several episodes where he was zoning out.  He was then told by coworkers that he fell to the floor.  Initially hitting his hip then his face.  He did have a bloody nose.  States that his coworkers told him that he was tremoring in all 4 extremities and was stiff.  No loss of bowels or bladder.  He states that when EMS came he did vomit on the gurney.  He reports that he has been under stress with work and did not sleep well the night before.  [facility]  The only other seizure medication he has tried is Keppra. ?  ?HISTORY 08/15/21: ?  ?Luis Bailey is a 26 year old male with a history of seizures.  He returns today for follow-up.  He remains on Depakote extended release 500 mg in the morning and 1000 mg at bedtime.  He reports that he had a seizure while at work.  Previous to the seizure he  was having several events where he would zone out and have jolts.  He also states that he was very sick prior to having the seizure.  Reports that he had a fever of 104 ?F.  Denies missing any medication.  He reports that he did lose consciousness with his seizure.  Reports that he was told that he was convulsing in all 4 extremities.  EMS was called and he was evaluated but did not go to the emergency room.  Since then he has not had any additional events.  He returns today for an evaluation. ?  ?11/27/20: Luis Bailey is a 26 year old male with a history of seizures.  He returns today for follow-up.  He is currently taking Depakote extended release 500 mg in the morning and  1000 mg at bedtime.  He denies any seizure events.  He operates a Librarian, academicmotor vehicle without difficulty.  He is able to complete all ADLs independently.  He describes occasionally he gets these jolts that he describes as zoning out.  He had these events prior to being diagnosed with seizures.  Unsure how often they occur.  Not daily.  He returns today for follow-up ?  ?11/27/19 Luis Bailey is a 26 year old male with a history of seizures.  He returns today for follow-up.  Remains on  Depakote extended release 500 mg in the morning and at 1000 mg in the evening.  No seizure events.  He operates a Librarian, academic.  Has a mild tremor in the hands.  Reports that he has been having GI upset.  He reports that he has a family history of diverticulitis.  He was curious if his GI upset could be from Depakote.  However he does not recall if he had any change in his GI when he started the medication.  He does note that in the last 3 to 4 months he has had 3-4 watery stools after dinnertime.  He returns today for an evaluation. ?  ?HISTORY 10/03/18: ?Luis Bailey is a 26 year old male with a history of seizures.  He returns today for follow-up.  He continues on Depakote extended release 500 mg in the morning and 1000 mg in the evening.  He denies any seizures.  He  continues to have a mild tremor in the hands.  He does not feel that this has gotten worse.  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  Able to complete all ADLs independently.  He returns today for evaluation. ? ?Handedness: Left handed  ? ?Onset: 6 years ago  ? ?Seizure Type: Generalized seizure   ? ?Current frequency: Last seizure March 17 ? ?Any injuries from seizures: Bruises ? ?Seizure risk factors: No seizures risk factors  ? ?Previous ASMs: Levetiracetam ? ?Currenty ASMs: Depakote 1000 mg AM and 1500 mg PM  ? ?ASMs side effects: Tired with LEV ? ?Brain Images: No acute abnormalities  ? ?Previous EEGs: Brief episodes of theta frequency ? ? ?OTHER MEDICAL CONDITIONS: Tic disorder, hypertension, hyperlipidemia, anxiety/depression  ? ?REVIEW OF SYSTEMS: Full 14 system review of systems performed and negative with exception of: as noted in the HPI  ? ?ALLERGIES: ?Allergies  ?Allergen Reactions  ? Carrot [Daucus Carota] Anaphylaxis  ?  RAW CARROTS CAUSE THROAT TO ITCH   ? ? ?HOME MEDICATIONS: ?Outpatient Medications Prior to Visit  ?Medication Sig Dispense Refill  ? divalproex (DEPAKOTE ER) 500 MG 24 hr tablet Take 1000 mg PO in the AM and 1500 mg in the PM 450 tablet 3  ? FLUoxetine (PROZAC) 40 MG capsule Take 80 mg by mouth in the morning.    ? ibuprofen (ADVIL,MOTRIN) 200 MG tablet Take 200 mg by mouth every 6 (six) hours as needed for headache.    ? melatonin 5 MG TABS Take 5 mg by mouth at bedtime as needed (sleep).    ? cloNIDine (CATAPRES) 0.3 MG tablet Take 0.3 mg by mouth at bedtime.    ? MOUNJARO 7.5 MG/0.5ML Pen Inject 7.5 mg into the skin once a week. Take on Tuesdays.    ? ?No facility-administered medications prior to visit.  ? ? ?PAST MEDICAL HISTORY: ?Past Medical History:  ?Diagnosis Date  ? Anxiety   ? Depression   ? High cholesterol   ? Hypertension   ? Seizure disorder (HCC) 10/26/2017  ? Seizures (HCC)   ? ? ?PAST SURGICAL HISTORY: ?Past Surgical History:  ?Procedure Laterality Date  ?  CIRCUMCISION    ? MOLE REMOVAL    ? WISDOM TOOTH EXTRACTION    ? ? ?FAMILY HISTORY: ?Family History  ?Problem Relation Age of Onset  ? Diabetes type II Maternal Grandmother   ? High blood pressure Maternal Grandmother   ? High Cholesterol Maternal Grandmother   ? Diabetes type II Maternal Grandfather   ? Myasthenia gravis Maternal Grandfather   ? Melanoma Maternal  Grandfather   ?     died at age 35  ? Diabetes type II Paternal Grandmother   ? High blood pressure Paternal Grandmother   ? High Cholesterol Paternal Grandmother   ? ? ?SOCIAL HISTORY: ?Social History  ? ?Socioeconomic History  ? Marital status: Single  ?  Spouse name: Not on file  ? Number of children: Not on file  ? Years of education: Not on file  ? Highest education level: Not on file  ?Occupational History  ? Not on file  ?Tobacco Use  ? Smoking status: Never  ? Smokeless tobacco: Never  ?Substance and Sexual Activity  ? Alcohol use: Yes  ?  Alcohol/week: 1.0 standard drink  ?  Types: 1 Shots of liquor per week  ?  Comment: occaisional   ? Drug use: No  ? Sexual activity: Never  ?Other Topics Concern  ? Not on file  ?Social History Narrative  ? Luis Bailey is a 26 yo male.  ? He does not attend school.  ? 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  ? He enjoys running the [employer], shopping, eating and getting pedicures.  ? ?Social Determinants of Health  ? ?Financial Resource Strain: Not on file  ?Food Insecurity: Not on file  ?Transportation Needs: Not on file  ?Physical Activity: Not on file  ?Stress: Not on file  ?Social Connections: Not on file  ?Intimate Partner Violence: Not on file  ? ? ?PHYSICAL EXAM ? ?GENERAL EXAM/CONSTITUTIONAL: ?Vitals:  ?Vitals:  ? 01/14/22 1200  ?BP: 137/87  ?Pulse: 79  ?Weight: 290 lb (131.5 kg)  ?Height: 6\' 4"  (1.93 m)  ? ?Body mass index is 35.3 kg/m?. ?Wt Readings from Last 3 Encounters:  ?01/14/22 290 lb (131.5 kg)  ?08/15/21 287 lb (130.2 kg)  ?11/27/20 (!) 314 lb (142.4 kg)  ? ?Patient is in no distress; well  developed, nourished and groomed; neck is supple ? ?EYES: ?Pupils round and reactive to light, Visual fields full to confrontation, Extraocular movements intacts,  ?No results found. ? ?MUSCULOSKELETAL: ?Gait, str

## 2022-01-15 LAB — CBC
Hematocrit: 47.2 % (ref 37.5–51.0)
Hemoglobin: 15.9 g/dL (ref 13.0–17.7)
MCH: 29.7 pg (ref 26.6–33.0)
MCHC: 33.7 g/dL (ref 31.5–35.7)
MCV: 88 fL (ref 79–97)
Platelets: 207 10*3/uL (ref 150–450)
RBC: 5.36 x10E6/uL (ref 4.14–5.80)
RDW: 13.6 % (ref 11.6–15.4)
WBC: 3.5 10*3/uL (ref 3.4–10.8)

## 2022-01-15 LAB — COMPREHENSIVE METABOLIC PANEL
ALT: 32 IU/L (ref 0–44)
AST: 15 IU/L (ref 0–40)
Albumin/Globulin Ratio: 1.8 (ref 1.2–2.2)
Albumin: 4.6 g/dL (ref 4.1–5.2)
Alkaline Phosphatase: 47 IU/L (ref 44–121)
BUN/Creatinine Ratio: 13 (ref 9–20)
BUN: 11 mg/dL (ref 6–20)
Bilirubin Total: 0.2 mg/dL (ref 0.0–1.2)
CO2: 21 mmol/L (ref 20–29)
Calcium: 9.4 mg/dL (ref 8.7–10.2)
Chloride: 105 mmol/L (ref 96–106)
Creatinine, Ser: 0.82 mg/dL (ref 0.76–1.27)
Globulin, Total: 2.5 g/dL (ref 1.5–4.5)
Glucose: 91 mg/dL (ref 70–99)
Potassium: 4.8 mmol/L (ref 3.5–5.2)
Sodium: 142 mmol/L (ref 134–144)
Total Protein: 7.1 g/dL (ref 6.0–8.5)
eGFR: 126 mL/min/{1.73_m2} (ref >59)

## 2022-01-15 LAB — VALPROIC ACID LEVEL: Valproic Acid Lvl: 58 ug/mL (ref 50–100)

## 2022-01-19 ENCOUNTER — Ambulatory Visit: Payer: Managed Care, Other (non HMO) | Admitting: Neurology

## 2022-01-19 DIAGNOSIS — R569 Unspecified convulsions: Secondary | ICD-10-CM

## 2022-01-19 DIAGNOSIS — Z5181 Encounter for therapeutic drug level monitoring: Secondary | ICD-10-CM

## 2022-01-26 NOTE — Procedures (Signed)
? ? ?  History: ? ?26 year old man with seizure disorder  ? ?EEG classification: Awake and drowsy ? ?Description of the recording: The background rhythms of this recording consists of a fairly well modulated medium amplitude alpha rhythm of 11-12 Hz that is reactive to eye opening and closure. As the record progresses, the patient appears to remain in the waking state throughout the recording. Photic stimulation was performed, did not show any abnormalities. Hyperventilation was also performed, did not show any abnormalities. Toward the end of the recording, the patient enters the drowsy state with slight symmetric slowing seen. The patient never enters stage II sleep. No abnormal epileptiform discharges seen during this recording. There was no focal slowing. EKG monitor shows no evidence of cardiac rhythm abnormalities with a heart rate of 72. ? ?Abnormality: None  ? ?Impression: This is a normal EEG recording in the waking and drowsy state. No evidence of interictal epileptiform discharges seen. A normal EEG does not exclude a diagnosis of epilepsy.  ? ? ?Windell Norfolk, MD ?[facility] ?  ?

## 2022-01-29 ENCOUNTER — Encounter: Payer: Self-pay | Admitting: Neurology

## 2022-02-04 ENCOUNTER — Encounter: Payer: Self-pay | Admitting: Adult Health

## 2022-04-17 ENCOUNTER — Other Ambulatory Visit: Payer: Self-pay | Admitting: Adult Health

## 2022-04-20 ENCOUNTER — Encounter: Payer: Self-pay | Admitting: Adult Health

## 2023-05-08 ENCOUNTER — Other Ambulatory Visit: Payer: Self-pay | Admitting: Adult Health

## 2023-05-13 NOTE — Telephone Encounter (Signed)
Pt called wanting to know when his medication will be called in for him.

## 2023-05-19 ENCOUNTER — Other Ambulatory Visit: Payer: Self-pay | Admitting: *Deleted

## 2023-05-19 MED ORDER — DIVALPROEX SODIUM ER 500 MG PO TB24: ORAL_TABLET | ORAL | 0 refills | Status: DC

## 2023-05-26 ENCOUNTER — Telehealth: Payer: Self-pay | Admitting: Neurology

## 2023-05-26 NOTE — Telephone Encounter (Signed)
Pt reports that more so today he has noticed concerns with his vision.  Pt states he doesn't want to refer to it as blurry but it is not normal, especially when looking at small items.  Pt aware of upcoming appointment, but wanted to try and speak with a RN about this as soon as possible.

## 2023-05-28 NOTE — Telephone Encounter (Signed)
He has an appointment with Aundra Millet on Wednesday.

## 2023-05-31 ENCOUNTER — Encounter: Payer: Self-pay | Admitting: Adult Health

## 2023-06-01 NOTE — Progress Notes (Signed)
PATIENT: LARONN CARITHERS DOB: 01-Feb-1996  REASON FOR VISIT: follow up HISTORY FROM: patient    PATIENT: CHRISTIANO LEDMAN DOB: 12/23/1995  REASON FOR VISIT: follow up HISTORY FROM: patient  Chief Complaint  Patient presents with   Follow-up    Rm 4 with mother Mayme Genta, Pt is well, reports Friday Aug 23 he thinks he had a seizure. He couldn't focus at work, loss consciousness, had a headache, nausea and felt weird. He was stressed and sleep deprived.      HISTORY OF PRESENT ILLNESS: Today 06/01/23:  DYWAYNE GAMEROS is a 27 y.o. male with a history of Seizures. Returns today for follow-up.  Here today with his mother.  Patient reports that he thinks he had a seizure on August 23.  Reports that he was at [facility] [employer] [facility] not focus all morning, went to the bathroom, had loss of consciousness followed by headache nausea.  When he fell he did hit his head.  Injured his shoulder.  Reports that he fractured the humeral head and is in a sling today.  He states that he was stressed and sleep deprived.  This event was not witnessed by anyone.  Reports that he had a small place on his lip where he could have bit it but there was no blood.  Denies urinary incontinence.  He also acknowledges that he sometimes will forget his morning medication however he does not remember missing any of his medication that week.  When he fell he did hit his head- vision feels off since then- describes it has hazy. Not on blood thinners.  Did go to urgent care but no imaging of the brain.  Has an appointment this evening with ophthalmology.  4/19/23Eliberto Ivory presents today for follow-up.  He is accompanied by his mother.  He reported his last seizure was on March 17 and in the setting of lack of sleep.  Since then his Depakote has been increased to 1000 mg in the morning and 1500 mg in the evening.  He denies any additional seizures since the increase in medication.  Denies any side effect from the medication, no other  complaints   12/17/21: Mr. Grissett is a 27 year old male with a history of seizures.  He returns today for follow-up.  At the last visit Depakote extended release was increased to 1000 mg twice a day.  He states that he had another seizure event.  I reviewed the EMS report.  The patient states that he was at work.  He was having several episodes where he was zoning out.  He was then told by coworkers that he fell to the floor.  Initially hitting his hip then his face.  He did have a bloody nose.  States that his coworkers told him that he was tremoring in all 4 extremities and was stiff.  No loss of bowels or bladder.  He states that when EMS came he did vomit on the gurney.  He reports that he has been under stress with work and did not sleep well the night before.  Reports that he recently started [facility].  The only other seizure medication he has tried is Keppra.  HISTORY 08/15/21:   Mr. Destin is a 27 year old male with a history of seizures.  He returns today for follow-up.  He remains on Depakote extended release 500 mg in the morning and 1000 mg at bedtime.  He reports that he had a seizure while at work.  Previous to the seizure he  was having several events where he would zone out and have jolts.  He also states that he was very sick prior to having the seizure.  Reports that he had a fever of 104 F.  Denies missing any medication.  He reports that he did lose consciousness with his seizure.  Reports that he was told that he was convulsing in all 4 extremities.  EMS was called and he was evaluated but did not go to the emergency room.  Since then he has not had any additional events.  He returns today for an evaluation.   11/27/20: Mr. Fenwick is a 27 year old male with a history of seizures.  He returns today for follow-up.  He is currently taking Depakote extended release 500 mg in the morning and  1000 mg at bedtime.  He denies any seizure events.  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  He  is able to complete all ADLs independently.  He describes occasionally he gets these jolts that he describes as zoning out.  He had these events prior to being diagnosed with seizures.  Unsure how often they occur.  Not daily.  He returns today for follow-up   11/27/19 Mr. Bouder is a 27 year old male with a history of seizures.  He returns today for follow-up.  Remains on Depakote extended release 500 mg in the morning and at 1000 mg in the evening.  No seizure events.  He operates a Librarian, academic.  Has a mild tremor in the hands.  Reports that he has been having GI upset.  He reports that he has a family history of diverticulitis.  He was curious if his GI upset could be from Depakote.  However he does not recall if he had any change in his GI when he started the medication.  He does note that in the last 3 to 4 months he has had 3-4 watery stools after dinnertime.  He returns today for an evaluation.   HISTORY 10/03/18: Mr. Koh is a 27 year old male with a history of seizures.  He returns today for follow-up.  He continues on Depakote extended release 500 mg in the morning and 1000 mg in the evening.  He denies any seizures.  He continues to have a mild tremor in the hands.  He does not feel that this has gotten worse.  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  Able to complete all ADLs independently.  He returns today for evaluation.  REVIEW OF SYSTEMS: Out of a complete 14 system review of symptoms, the patient complains only of the following symptoms, and all other reviewed systems are negative.  ALLERGIES: Allergies  Allergen Reactions   Carrot [Daucus Carota] Anaphylaxis    RAW CARROTS CAUSE THROAT TO [facility]     HOME MEDICATIONS: Outpatient Medications Prior to Visit  Medication Sig Dispense Refill   diazePAM, 15 MG Dose, (VALTOCO 15 MG DOSE) 2 x 7.5 MG/0.1ML LQPK Place 7.5 mg into the nose as needed (For seizure lasting more than 5 minutes or seizure clusters). 1 each 2    divalproex (DEPAKOTE ER) 500 MG 24 hr tablet TAKE 2 TABLETS BY MOUTH EVERY MORNING AND TAKE THREE TABLETS BY MOUTH EVERY EVENING. Must be seen for further refills. 150 tablet 0   FLUoxetine (PROZAC) 40 MG capsule Take 80 mg by mouth in the morning.     ibuprofen (ADVIL,MOTRIN) 200 MG tablet Take 200 mg by mouth every 6 (six) hours as needed for headache.     melatonin  5 MG TABS Take 5 mg by mouth at bedtime as needed (sleep).     No facility-administered medications prior to visit.    PAST MEDICAL HISTORY: Past Medical History:  Diagnosis Date   Anxiety    Depression    High cholesterol    Hypertension    Seizure disorder (HCC) 10/26/2017   Seizures (HCC)     PAST SURGICAL HISTORY: Past Surgical History:  Procedure Laterality Date   CIRCUMCISION     MOLE REMOVAL     WISDOM TOOTH EXTRACTION      FAMILY HISTORY: Family History  Problem Relation Age of Onset   Diabetes type II Maternal Grandmother    High blood pressure Maternal Grandmother    High Cholesterol Maternal Grandmother    Diabetes type II Maternal Grandfather    Myasthenia gravis Maternal Grandfather    Melanoma Maternal Grandfather        died at age 4   Diabetes type II Paternal Grandmother    High blood pressure Paternal Grandmother    High Cholesterol Paternal Grandmother     SOCIAL HISTORY: Social History   Socioeconomic History   Marital status: Single    Spouse name: Not on file   Number of children: Not on file   Years of education: Not on file   Highest education level: Not on file  Occupational History   Not on file  Tobacco Use   Smoking status: Never   Smokeless tobacco: Never  Substance and Sexual Activity   Alcohol use: Yes    Alcohol/week: 1.0 standard drink of alcohol    Types: 1 Shots of liquor per week    Comment: occaisional    Drug use: No   Sexual activity: Never  Other Topics Concern   Not on file  Social History Narrative   Paull is a 27 yo male.   He does not attend  school.   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   He enjoys running the [employer], shopping, eating and getting pedicures.   Social Determinants of Health   Financial Resource Strain: Unknown (05/14/2021)   Received from [facility], [facility]   Overall Financial Resource Strain (CARDIA)    Difficulty of Paying Living Expenses: Patient declined  Food Insecurity: No Food Insecurity (02/05/2022)   Received from [facility], [facility]   Hunger Vital Sign    Worried About Running Out of Food in the Last Year: Never true    Ran Out of Food in the Last Year: Never true  Transportation Needs: No Transportation Needs (05/14/2021)   Received from [facility], [facility]   PRAPARE - Transportation    Lack of Transportation (Medical): No    Lack of Transportation (Non-Medical): No  Physical Activity: Inactive (05/14/2021)   Received from [facility], [facility]   Exercise Vital Sign    Days of Exercise per Week: 0 days    Minutes of Exercise per Session: 0 min  Stress: Unknown (05/14/2021)   Received from [facility], [facility] - Occupational Stress Questionnaire    Feeling of Stress : Patient declined  Social Connections: Unknown (01/26/2022)   Received from [facility], [facility]   Social Network    Social Network: Not on file  Intimate Partner Violence: Unknown (12/29/2021)   Received from [facility], [facility]   HITS    Physically Hurt: Not on file    Insult or Talk Down To:  Not on file    Threaten Physical Harm: Not on file    Scream or Curse: Not on file      PHYSICAL EXAM Generalized: Well developed, in no acute distress   Neurological examination  Mentation: Alert oriented to time, place, history taking. Follows all commands speech and language fluent Cranial nerve II-XII: Fundoscopic exam reveals sharp disc margins.Pupils were equal round reactive to light extraocular movements were full,  visual field were full on confrontational test. Facial sensation and strength were normal. Uvula tongue midline. Head turning and shoulder shrug  were normal and symmetric. Motor: The motor testing reveals 5 over 5 strength of all 4 extremities. Good symmetric motor tone is noted throughout.  Sensory: Sensory testing is intact to pinprick, soft touch, vibration sensation, and position sense on all 4 extremities. No evidence of extinction is noted.  Coordination: Cerebellar testing reveals good finger-nose-finger and heel-to-shin bilaterally.  Gait and station: Gait is normal.  Reflexes: Deep tendon reflexes are symmetric and normal bilaterally.    DIAGNOSTIC DATA (LABS, IMAGING, TESTING) - I reviewed patient records, labs, notes, testing and imaging myself where available.  Lab Results  Component Value Date   WBC 3.5 01/14/2022   HGB 15.9 01/14/2022   HCT 47.2 01/14/2022   MCV 88 01/14/2022   PLT 207 01/14/2022      Component Value Date/Time   NA 142 01/14/2022 1318   K 4.8 01/14/2022 1318   CL 105 01/14/2022 1318   CO2 21 01/14/2022 1318   GLUCOSE 91 01/14/2022 1318   GLUCOSE 89 12/12/2021 1554   BUN 11 01/14/2022 1318   CREATININE 0.82 01/14/2022 1318   CALCIUM 9.4 01/14/2022 1318   PROT 7.1 01/14/2022 1318   ALBUMIN 4.6 01/14/2022 1318   AST 15 01/14/2022 1318   ALT 32 01/14/2022 1318   ALKPHOS 47 01/14/2022 1318   BILITOT 0.2 01/14/2022 1318   GFRNONAA >60 12/12/2021 1554   GFRAA 142 11/27/2019 1157      ASSESSMENT AND PLAN 27 y.o. year old male  has a past medical history of Anxiety, Depression, High cholesterol, Hypertension, Seizure disorder (HCC) (10/26/2017), and Seizures (HCC). here with :  1.  Seizure 2.  Fall-with head trauma  Continue Depakote 1000 mg in the morning and 1500 mg in the evening Will check blood work today, CBC, CMP and Depakote level. Pending blood work will decide on medication changes Patient was advised that he should not operate a  motor vehicle until seizure-free for 6 months Discussed seizure precautions with the patient CT head without contrast due to head trauma with reported blurry vision He will follow-up in 6 months      [name], MSN, NP-C 06/01/2023, 4:21 PM [facility] [address] [place], [place] [address] [phone]

## 2023-06-02 ENCOUNTER — Ambulatory Visit (INDEPENDENT_AMBULATORY_CARE_PROVIDER_SITE_OTHER): Payer: Managed Care, Other (non HMO) | Admitting: Adult Health

## 2023-06-02 ENCOUNTER — Telehealth: Payer: Self-pay | Admitting: Adult Health

## 2023-06-02 ENCOUNTER — Encounter: Payer: Self-pay | Admitting: Adult Health

## 2023-06-02 VITALS — BP 143/90 | HR 75 | Ht 76.0 in | Wt 298.0 lb

## 2023-06-02 DIAGNOSIS — Z5181 Encounter for therapeutic drug level monitoring: Secondary | ICD-10-CM | POA: Diagnosis not present

## 2023-06-02 DIAGNOSIS — H538 Other visual disturbances: Secondary | ICD-10-CM

## 2023-06-02 DIAGNOSIS — R569 Unspecified convulsions: Secondary | ICD-10-CM

## 2023-06-02 DIAGNOSIS — W19XXXA Unspecified fall, initial encounter: Secondary | ICD-10-CM

## 2023-06-02 DIAGNOSIS — S0990XA Unspecified injury of head, initial encounter: Secondary | ICD-10-CM | POA: Diagnosis not present

## 2023-06-02 NOTE — Telephone Encounter (Signed)
sent to GI they obtain [employer] auth [id number] 

## 2023-06-03 ENCOUNTER — Telehealth: Payer: Self-pay | Admitting: Adult Health

## 2023-06-03 LAB — CBC WITH DIFFERENTIAL/PLATELET
Basophils Absolute: 0.1 10*3/uL (ref 0.0–0.2)
Basos: 1 %
EOS (ABSOLUTE): 0.2 10*3/uL (ref 0.0–0.4)
Eos: 5 %
Hematocrit: 44.5 % (ref 37.5–51.0)
Hemoglobin: 14.6 g/dL (ref 13.0–17.7)
Immature Grans (Abs): 0 10*3/uL (ref 0.0–0.1)
Immature Granulocytes: 0 %
Lymphocytes Absolute: 1.5 10*3/uL (ref 0.7–3.1)
Lymphs: 43 %
MCH: 28.3 pg (ref 26.6–33.0)
MCHC: 32.8 g/dL (ref 31.5–35.7)
MCV: 86 fL (ref 79–97)
Monocytes Absolute: 0.3 10*3/uL (ref 0.1–0.9)
Monocytes: 7 %
Neutrophils Absolute: 1.6 10*3/uL (ref 1.4–7.0)
Neutrophils: 44 %
Platelets: 220 10*3/uL (ref 150–450)
RBC: 5.15 x10E6/uL (ref 4.14–5.80)
RDW: 12.6 % (ref 11.6–15.4)
WBC: 3.6 10*3/uL (ref 3.4–10.8)

## 2023-06-03 LAB — COMPREHENSIVE METABOLIC PANEL
ALT: 29 IU/L (ref 0–44)
AST: 15 IU/L (ref 0–40)
Albumin: 4.6 g/dL (ref 4.3–5.2)
Alkaline Phosphatase: 77 IU/L (ref 44–121)
BUN/Creatinine Ratio: 17 (ref 9–20)
BUN: 12 mg/dL (ref 6–20)
Bilirubin Total: 0.2 mg/dL (ref 0.0–1.2)
CO2: 23 mmol/L (ref 20–29)
Calcium: 9.2 mg/dL (ref 8.7–10.2)
Chloride: 103 mmol/L (ref 96–106)
Creatinine, Ser: 0.72 mg/dL — ABNORMAL LOW (ref 0.76–1.27)
Globulin, Total: 2.5 g/dL (ref 1.5–4.5)
Glucose: 89 mg/dL (ref 70–99)
Potassium: 4.5 mmol/L (ref 3.5–5.2)
Sodium: 141 mmol/L (ref 134–144)
Total Protein: 7.1 g/dL (ref 6.0–8.5)
eGFR: 129 mL/min/{1.73_m2} (ref >59)

## 2023-06-03 LAB — VALPROIC ACID LEVEL: Valproic Acid Lvl: 58 ug/mL (ref 50–100)

## 2023-06-03 NOTE — Telephone Encounter (Signed)
I called the patient to discuss plan of care.  No answer left voicemail

## 2023-06-04 ENCOUNTER — Encounter: Payer: Self-pay | Admitting: Adult Health

## 2023-06-04 NOTE — Telephone Encounter (Signed)
I called patient and LVM- asking him to call our office.

## 2023-06-07 MED ORDER — DIVALPROEX SODIUM ER 500 MG PO TB24: 1500.0000 mg | ORAL_TABLET | Freq: Two times a day (BID) | ORAL | 11 refills | Status: DC

## 2023-06-07 MED ORDER — LAMOTRIGINE 25 MG PO TABS: ORAL_TABLET | ORAL | 1 refills | Status: DC

## 2023-06-07 NOTE — Telephone Encounter (Signed)
FYI

## 2023-06-07 NOTE — Telephone Encounter (Signed)
I called the patient and spoke to him today on the phone.  I reviewed that I had discussed with Dr. Teresa Coombs.  He recommended that we increase Depakote to 1500 mg twice a day and start him on Lamictal.  Lamictal dosing is below:  Week 1: Start with Lamictal 25 mg every other day  Week 2: Increase Lamictal to 25 mg every day  Week 3: Increase Lamictal to 25 mg twice daily  Week 4: Increase Lamictal to 50 mg AM and 25 mg PM  Week 5: Increase Lamictal to 50 mg twice daily  Week 6: Increase Lamictal to 75 mg AM and 50 mg PM  Week 7: Increase Lamictal to 75 mg twice daily  Week 8: Increase Lamictal to 100 mg AM and 75 mg PM  Week 9: Increase Lamictal to 100 mg twice daily

## 2023-06-08 ENCOUNTER — Other Ambulatory Visit: Payer: Self-pay | Admitting: Adult Health

## 2023-06-08 DIAGNOSIS — Z5181 Encounter for therapeutic drug level monitoring: Secondary | ICD-10-CM

## 2023-06-09 ENCOUNTER — Ambulatory Visit
Admission: RE | Admit: 2023-06-09 | Discharge: 2023-06-09 | Disposition: A | Discharge status: Home / Self Care | Payer: 59 | Source: Ambulatory Visit | Attending: Adult Health | Admitting: Adult Health

## 2023-06-09 DIAGNOSIS — S0990XA Unspecified injury of head, initial encounter: Secondary | ICD-10-CM

## 2023-06-09 DIAGNOSIS — W19XXXA Unspecified fall, initial encounter: Secondary | ICD-10-CM

## 2023-06-09 DIAGNOSIS — H538 Other visual disturbances: Secondary | ICD-10-CM

## 2023-08-11 ENCOUNTER — Other Ambulatory Visit: Payer: Self-pay | Admitting: Adult Health

## 2023-08-12 NOTE — Telephone Encounter (Signed)
LVM for pt tp call back to discuss Medication dosage

## 2023-08-16 ENCOUNTER — Other Ambulatory Visit: Payer: Self-pay | Admitting: *Deleted

## 2023-08-16 NOTE — Telephone Encounter (Signed)
Can you retry calling

## 2023-08-17 ENCOUNTER — Encounter: Payer: Self-pay | Admitting: Adult Health

## 2023-08-17 NOTE — Telephone Encounter (Addendum)
Talked to pt  confirmed dosage and Pt states tolerating lactimal and  no seizure activity . MeganNP aware

## 2023-08-19 ENCOUNTER — Other Ambulatory Visit (INDEPENDENT_AMBULATORY_CARE_PROVIDER_SITE_OTHER): Payer: Self-pay

## 2023-08-19 DIAGNOSIS — Z5181 Encounter for therapeutic drug level monitoring: Secondary | ICD-10-CM

## 2023-08-19 DIAGNOSIS — Z0289 Encounter for other administrative examinations: Secondary | ICD-10-CM

## 2023-08-20 LAB — CBC WITH DIFFERENTIAL/PLATELET
Basophils Absolute: 0 10*3/uL (ref 0.0–0.2)
Basos: 1 %
EOS (ABSOLUTE): 0.2 10*3/uL (ref 0.0–0.4)
Eos: 5 %
Hematocrit: 45.5 % (ref 37.5–51.0)
Hemoglobin: 14.8 g/dL (ref 13.0–17.7)
Immature Grans (Abs): 0 10*3/uL (ref 0.0–0.1)
Immature Granulocytes: 1 %
Lymphocytes Absolute: 1.4 10*3/uL (ref 0.7–3.1)
Lymphs: 44 %
MCH: 28.5 pg (ref 26.6–33.0)
MCHC: 32.5 g/dL (ref 31.5–35.7)
MCV: 88 fL (ref 79–97)
Monocytes Absolute: 0.2 10*3/uL (ref 0.1–0.9)
Monocytes: 6 %
Neutrophils Absolute: 1.4 10*3/uL (ref 1.4–7.0)
Neutrophils: 43 %
Platelets: 207 10*3/uL (ref 150–450)
RBC: 5.19 x10E6/uL (ref 4.14–5.80)
RDW: 13.3 % (ref 11.6–15.4)
WBC: 3.2 10*3/uL — ABNORMAL LOW (ref 3.4–10.8)

## 2023-08-20 LAB — COMPREHENSIVE METABOLIC PANEL
ALT: 32 [IU]/L (ref 0–44)
AST: 20 [IU]/L (ref 0–40)
Albumin: 4.7 g/dL (ref 4.3–5.2)
Alkaline Phosphatase: 65 [IU]/L (ref 44–121)
BUN/Creatinine Ratio: 15 (ref 9–20)
BUN: 13 mg/dL (ref 6–20)
Bilirubin Total: 0.3 mg/dL (ref 0.0–1.2)
CO2: 23 mmol/L (ref 20–29)
Calcium: 9.6 mg/dL (ref 8.7–10.2)
Chloride: 100 mmol/L (ref 96–106)
Creatinine, Ser: 0.87 mg/dL (ref 0.76–1.27)
Globulin, Total: 2.5 g/dL (ref 1.5–4.5)
Glucose: 186 mg/dL — ABNORMAL HIGH (ref 70–99)
Potassium: 4.1 mmol/L (ref 3.5–5.2)
Sodium: 138 mmol/L (ref 134–144)
Total Protein: 7.2 g/dL (ref 6.0–8.5)
eGFR: 122 mL/min/{1.73_m2} (ref >59)

## 2023-08-20 LAB — VALPROIC ACID LEVEL: Valproic Acid Lvl: 60 ug/mL (ref 50–100)

## 2023-08-20 LAB — LAMOTRIGINE LEVEL: Lamotrigine Lvl: 5.2 ug/mL (ref 2.0–20.0)

## 2024-06-03 ENCOUNTER — Other Ambulatory Visit: Payer: Self-pay | Admitting: Adult Health

## 2024-06-06 NOTE — Telephone Encounter (Signed)
 Spoke to mom (DPR checked ) verified depokote dosage . Made her aware will send 30 day refill to pharmacy however for further refills pt needs a f/u visit with [name],NP  mom expressed understanding and thanked me for calling

## 2024-06-06 NOTE — Telephone Encounter (Signed)
 Mother returned phone call to verify dosage Depakote  1500 mg in morning, 1500 in evening.

## 2024-06-06 NOTE — Telephone Encounter (Signed)
 LVM for mom (checked DPR ) to call back to verify dosage of Depakote 

## 2024-06-07 ENCOUNTER — Telehealth: Payer: Self-pay | Admitting: Adult Health

## 2024-06-07 NOTE — Telephone Encounter (Signed)
 Pt understands that although there may be some limitations with this type of visit, we will take all precautions to reduce any security or privacy concerns.  Pt understands that this will be treated like an in office visit and we will file with pt's insurance, and there may be a patient responsible charge related to this service.  Pt also asking if the refill for the divalproex  (DEPAKOTE  ER) 500 MG 24 hr tablet to  [facility] [phone]  can be called in now and he pick up sometime next month

## 2024-06-07 NOTE — Telephone Encounter (Signed)
 This was refilled yesterday afternoon. Please let the patient know it was sent to [facility] on [facility]

## 2024-06-08 NOTE — Telephone Encounter (Signed)
 Called PT , pT has picked up medication

## 2024-07-02 ENCOUNTER — Other Ambulatory Visit: Payer: Self-pay | Admitting: Adult Health

## 2024-07-31 ENCOUNTER — Other Ambulatory Visit: Payer: Self-pay | Admitting: Adult Health

## 2024-08-07 ENCOUNTER — Telehealth: Admitting: Adult Health

## 2024-08-07 DIAGNOSIS — R569 Unspecified convulsions: Secondary | ICD-10-CM | POA: Diagnosis not present

## 2024-08-07 DIAGNOSIS — Z5181 Encounter for therapeutic drug level monitoring: Secondary | ICD-10-CM

## 2024-08-07 MED ORDER — LAMOTRIGINE 100 MG PO TABS: 100.0000 mg | ORAL_TABLET | Freq: Two times a day (BID) | ORAL | 3 refills | Status: AC

## 2024-08-07 MED ORDER — DIVALPROEX SODIUM ER 500 MG PO TB24: ORAL_TABLET | ORAL | 3 refills | Status: AC

## 2024-08-07 NOTE — Patient Instructions (Signed)
 Your Plan:  Continue Depakote  1500 mg twice a day Continue Lamictal  100 mg twice a day Please come the office to have blood work checked  Thank you for coming to see us  at [facility]. I hope we have been able to provide you high quality care today.  You may receive a patient satisfaction survey over the next few weeks. We would appreciate your feedback and comments so that we may continue to improve ourselves and the health of our patients.   SEIZURE PRECAUTIONS Per [place]  DMV statutes, patients with seizures are not allowed to drive until they have been seizure-free for six months.    Use caution when using heavy equipment or power tools. Avoid working on ladders or at heights. Take showers instead of baths. Ensure the water temperature is not too high on the home water heater. Do not go swimming alone. Do not lock yourself in a room alone (i.e. bathroom). When caring for infants or small children, sit down when holding, feeding, or changing them to minimize risk of injury to the child in the event you have a seizure. Maintain good sleep hygiene. Avoid alcohol.    If patient has another seizure, call 911 and bring them back to the ED if: A.  The seizure lasts longer than 5 minutes.      B.  The patient doesn't wake shortly after the seizure or has new problems such as difficulty seeing, speaking or moving following the seizure C.  The patient was injured during the seizure D.  The patient has a temperature over 102 F (39C) E.  The patient vomited during the seizure and now is having trouble breathing

## 2024-08-07 NOTE — Progress Notes (Signed)
 PATIENT: Luis Bailey DOB: [birth year]  REASON FOR VISIT: follow up HISTORY FROM: patient  Virtual Visit via Video Note  I connected with Luis Bailey on 08/07/24 at  9:15 AM EST by a video enabled telemedicine application located remotely at [facility] and verified that I am speaking with the correct person using two identifiers who was located at their own home.  Verified that he is currently in [place]    I discussed the limitations of evaluation and management by telemedicine and the availability of in person appointments. The patient expressed understanding and agreed to proceed.   PATIENT: Luis Bailey DOB: May 08, 1996  REASON FOR VISIT: follow up HISTORY FROM: patient  HISTORY OF PRESENT ILLNESS: Today 08/07/24  Luis Bailey is a 28 y.o. male with a history of seizures. Returns today for follow-up.  Since last visit he reports has not had any seizure events.  He remains on Depakote  extended release 1500 mg twice a day and Lamictal  100 mg twice a day.  He denies any changes with his gait or balance.  No change in mood or behavior.  Blood work was completed a year ago.  Patient continues to work full-time at [employer].  Returns today for an evaluation.     HISTORY 06/01/23:   Luis Bailey is a 28 y.o. male with a history of Seizures. Returns today for follow-up.  Here today with his mother.  Patient reports that he thinks he had a seizure on August 23.  Reports that he was at [facility] not focus all morning, went to the bathroom, had loss of consciousness followed by headache nausea.  When he fell he did hit his head.  Injured his shoulder.  Reports that he fractured the humeral head and is in a sling today.  He states that he was stressed and sleep deprived.  This event was not witnessed by anyone.  Reports that he had a small place on his lip where he could have bit it but there was no blood.  Denies urinary incontinence.  He also  acknowledges that he sometimes will forget his morning medication however he does not remember missing any of his medication that week.   When he fell he did hit his head- vision feels off since then- describes it has hazy. Not on blood thinners.  Did go to urgent care but no imaging of the brain.  Has an appointment this evening with ophthalmology.   4/19/23BETHA Luis presents today for follow-up.  He is accompanied by his mother.  He reported his last seizure was on March 17 and in the setting of lack of sleep.  Since then his Depakote  has been increased to 1000 mg in the morning and 1500 mg in the evening.  He denies any additional seizures since the increase in medication.  Denies any side effect from the medication, no other complaints   12/17/21: Luis Bailey is a 28 year old male with a history of seizures.  He returns today for follow-up.  At the last visit Depakote  extended release was increased to 1000 mg twice a day.  He states that he had another seizure event.  I reviewed the EMS report.  The patient states that he was at work.  He was having several episodes where he was zoning out.  He was then told by coworkers that he fell to the floor.  Initially hitting his hip then his face.  He did have a bloody nose.  States that his coworkers told him that he was tremoring in all 4 extremities and was stiff.  No loss of bowels or bladder.  He states that when EMS came he did vomit on the gurney.  He reports that he has been under stress with work and did not sleep well the night before.  Reports that he recently started Mounjaro.  The only other seizure medication he has tried is Keppra .   HISTORY 08/15/21:   Luis Bailey is a 28 year old male with a history of seizures.  He returns today for follow-up.  He remains on Depakote  extended release 500 mg in the morning and 1000 mg at bedtime.  He reports that he had a seizure while at work.  Previous to the seizure he was having several events where he would  zone out and have jolts.  He also states that he was very sick prior to having the seizure.  Reports that he had a fever of 104 F.  Denies missing any medication.  He reports that he did lose consciousness with his seizure.  Reports that he was told that he was convulsing in all 4 extremities.  EMS was called and he was evaluated but did not go to the emergency room.  Since then he has not had any additional events.  He returns today for an evaluation.   11/27/20: Luis Bailey is a 28 year old male with a history of seizures.  He returns today for follow-up.  He is currently taking Depakote  extended release 500 mg in the morning and  1000 mg at bedtime.  He denies any seizure events.  He operates a librarian, academic without difficulty.  He is able to complete all ADLs independently.  He describes occasionally he gets these jolts that he describes as zoning out.  He had these events prior to being diagnosed with seizures.  Unsure how often they occur.  Not daily.  He returns today for follow-up   11/27/19 Luis Bailey is a 28 year old male with a history of seizures.  He returns today for follow-up.  Remains on Depakote  extended release 500 mg in the morning and at 1000 mg in the evening.  No seizure events.  He operates a librarian, academic.  Has a mild tremor in the hands.  Reports that he has been having GI upset.  He reports that he has a family history of diverticulitis.  He was curious if his GI upset could be from Depakote .  However he does not recall if he had any change in his GI when he started the medication.  He does note that in the last 3 to 4 months he has had 3-4 watery stools after dinnertime.  He returns today for an evaluation.   HISTORY 10/03/18: Luis Bailey is a 28 year old male with a history of seizures.  He returns today for follow-up.  He continues on Depakote  extended release 500 mg in the morning and 1000 mg in the evening.  He denies any seizures.  He continues to have a mild tremor in the hands.   He does not feel that this has gotten worse.  He operates a librarian, academic without difficulty.  Able to complete all ADLs independently.  He returns today for evaluation.  REVIEW OF SYSTEMS: Out of a complete 14 system review of symptoms, the patient complains only of the following symptoms, and all other reviewed systems are negative.  ALLERGIES: Allergies  Allergen Reactions   Carrot [Daucus Carota] Anaphylaxis    RAW CARROTS CAUSE  THROAT TO [facility]     HOME MEDICATIONS: Outpatient Medications Prior to Visit  Medication Sig Dispense Refill   diazePAM , 15 MG Dose, (VALTOCO  15 MG DOSE) 2 x 7.5 MG/0.1ML LQPK Place 7.5 mg into the nose as needed (For seizure lasting more than 5 minutes or seizure clusters). (Patient not taking: Reported on 06/02/2023) 1 each 2   FLUoxetine (PROZAC) 40 MG capsule Take 80 mg by mouth in the morning. (Patient not taking: Reported on 06/02/2023)     hydrOXYzine (ATARAX) 25 MG tablet      ibuprofen (ADVIL,MOTRIN) 200 MG tablet Take 200 mg by mouth every 8 (eight) hours as needed for headache.     Melatonin 10 MG TABS Take 10 mg by mouth at bedtime as needed (sleep).     sertraline (ZOLOFT) 100 MG tablet Take 100 mg by mouth daily.     traZODone (DESYREL) 100 MG tablet 100 mg.     divalproex  (DEPAKOTE  ER) 500 MG 24 hr tablet TAKE 3 TABLETS BY MOUTH 2 TIMES A DAY NEED APPOINTMENT 180 tablet 0   lamoTRIgine  (LAMICTAL ) 100 MG tablet Take 1 tablet (100 mg total) by mouth 2 (two) times daily. 180 tablet 3   No facility-administered medications prior to visit.    PAST MEDICAL HISTORY: Past Medical History:  Diagnosis Date   Anxiety    Depression    High cholesterol    Hypertension    Seizure disorder (HCC) 10/26/2017   Seizures (HCC)     PAST SURGICAL HISTORY: Past Surgical History:  Procedure Laterality Date   CIRCUMCISION     MOLE REMOVAL     WISDOM TOOTH EXTRACTION      FAMILY HISTORY: Family History  Problem Relation Age of Onset   Diabetes type II  Maternal Grandmother    High blood pressure Maternal Grandmother    High Cholesterol Maternal Grandmother    Diabetes type II Maternal Grandfather    Myasthenia gravis Maternal Grandfather    Melanoma Maternal Grandfather        died at age 74   Diabetes type II Paternal Grandmother    High blood pressure Paternal Grandmother    High Cholesterol Paternal Grandmother     SOCIAL HISTORY: Social History   Socioeconomic History   Marital status: Single    Spouse name: Not on file   Number of children: Not on file   Years of education: Not on file   Highest education level: Not on file  Occupational History   Not on file  Tobacco Use   Smoking status: Never   Smokeless tobacco: Never  Substance and Sexual Activity   Alcohol use: Yes    Alcohol/week: 1.0 standard drink of alcohol    Types: 1 Shots of liquor per week    Comment: occaisional    Drug use: No   Sexual activity: Never  Other Topics Concern   Not on file  Social History Narrative   Honorio is a 28 yo male.   He does not attend school.   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   He enjoys running the [place] tool works, shopping, eating and getting pedicures.   Social Drivers of Corporate Investment Banker Strain: Low Risk  (03/24/2024)   Received from [facility]   Overall Financial Resource Strain (CARDIA)    Difficulty of Paying Living Expenses: Not hard at all  Food Insecurity: No Food Insecurity (03/24/2024)   Received from [facility]   Hunger Vital Sign  Within the past 12 months, you worried that your food would run out before you got the money to buy more.: Never true    Within the past 12 months, the food you bought just didn't last and you didn't have money to get more.: Never true  Transportation Needs: No Transportation Needs (03/24/2024)   Received from [facility]   PRAPARE - Transportation    Lack of Transportation (Medical): No    Lack of Transportation (Non-Medical): No  Physical  Activity: Unknown (03/24/2024)   Received from [facility]   Exercise Vital Sign    On average, how many days per week do you engage in moderate to strenuous exercise (like a brisk walk)?: 0 days    Minutes of Exercise per Session: Not on file  Stress: No Stress Concern Present (03/24/2024)   Received from [facility] - Occupational Stress Questionnaire    Feeling of Stress : Only a little  Social Connections: Socially Integrated (03/24/2024)   Received from [facility]   Social Network    How would you rate your social network (family, work, friends)?: Good participation with social networks  Intimate Partner Violence: Not At Risk (03/24/2024)   Received from [facility]   HITS    Over the last 12 months how often did your partner physically hurt you?: Never    Over the last 12 months how often did your partner insult you or talk down to you?: Never    Over the last 12 months how often did your partner threaten you with physical harm?: Never    Over the last 12 months how often did your partner scream or curse at you?: Never      PHYSICAL EXAM Generalized: Well developed, in no acute distress   Neurological examination  Mentation: Alert oriented to time, place, history taking. Follows all commands speech and language fluent Cranial nerve II-XII: Facial symmetry noted.   DIAGNOSTIC DATA (LABS, IMAGING, TESTING) - I reviewed patient records, labs, notes, testing and imaging myself where available.  Lab Results  Component Value Date   WBC 3.2 (L) 08/19/2023   HGB 14.8 08/19/2023   HCT 45.5 08/19/2023   MCV 88 08/19/2023   PLT 207 08/19/2023      Component Value Date/Time   NA 138 08/19/2023 1502   K 4.1 08/19/2023 1502   CL 100 08/19/2023 1502   CO2 23 08/19/2023 1502   GLUCOSE 186 (H) 08/19/2023 1502   GLUCOSE 89 12/12/2021 1554   BUN 13 08/19/2023 1502   CREATININE 0.87 08/19/2023 1502   CALCIUM 9.6 08/19/2023 1502    PROT 7.2 08/19/2023 1502   ALBUMIN 4.7 08/19/2023 1502   AST 20 08/19/2023 1502   ALT 32 08/19/2023 1502   ALKPHOS 65 08/19/2023 1502   BILITOT 0.3 08/19/2023 1502   GFRNONAA >60 12/12/2021 1554   GFRAA 142 11/27/2019 1157      ASSESSMENT AND PLAN 28 y.o. year old male  has a past medical history of Anxiety, Depression, High cholesterol, Hypertension, Seizure disorder (HCC) (10/26/2017), and Seizures (HCC). here with:  Seizures  Continue Depakote  extended release 1500 mg twice a day Continue Lamictal  100 mg twice a day Blood work ordered CBC, CMP, Lamictal  and Depakote  level Advise if he has any seizure events he should let us  know Follow-up in 1 year or sooner if needed  Orders Placed This Encounter  Procedures   Comprehensive metabolic panel with GFR    Standing  Status:   Future    Expiration Date:   08/07/2025   CBC with Differential/Platelet    Standing Status:   Future    Expiration Date:   08/07/2025   Lamotrigine  level    Standing Status:   Future    Expiration Date:   08/07/2025   Valproic  acid level    Standing Status:   Future    Expiration Date:   08/07/2025    Meds ordered this encounter  Medications   divalproex  (DEPAKOTE  ER) 500 MG 24 hr tablet    Sig: TAKE 3 TABLETS BY MOUTH 2 TIMES A DAY    Dispense:  540 tablet    Refill:  3    Supervising Provider:   YAN, YIJUN [[id number]]   lamoTRIgine  (LAMICTAL ) 100 MG tablet    Sig: Take 1 tablet (100 mg total) by mouth 2 (two) times daily.    Dispense:  180 tablet    Refill:  3    Supervising Provider:   YAN, YIJUN [[id number]]     Duwaine Russell, MSN, NP-C 08/07/2024, 9:27 AM [facility] [address], [address] [place], [place] [address] [phone]

## 2024-08-16 ENCOUNTER — Other Ambulatory Visit: Payer: Self-pay | Admitting: Adult Health

## 2024-09-26 ENCOUNTER — Other Ambulatory Visit (INDEPENDENT_AMBULATORY_CARE_PROVIDER_SITE_OTHER): Payer: Self-pay

## 2024-09-26 ENCOUNTER — Other Ambulatory Visit: Payer: Self-pay

## 2024-09-26 DIAGNOSIS — Z5181 Encounter for therapeutic drug level monitoring: Secondary | ICD-10-CM

## 2024-09-26 DIAGNOSIS — Z0289 Encounter for other administrative examinations: Secondary | ICD-10-CM

## 2024-09-27 ENCOUNTER — Ambulatory Visit: Payer: Self-pay | Admitting: *Deleted

## 2024-09-27 LAB — CBC WITH DIFFERENTIAL/PLATELET
Basophils Absolute: 0 x10E3/uL (ref 0.0–0.2)
Basos: 1 %
EOS (ABSOLUTE): 0.1 x10E3/uL (ref 0.0–0.4)
Eos: 4 %
Hematocrit: 45 % (ref 37.5–51.0)
Hemoglobin: 14.6 g/dL (ref 13.0–17.7)
Immature Grans (Abs): 0 x10E3/uL (ref 0.0–0.1)
Immature Granulocytes: 0 %
Lymphocytes Absolute: 1.6 x10E3/uL (ref 0.7–3.1)
Lymphs: 45 %
MCH: 28.5 pg (ref 26.6–33.0)
MCHC: 32.4 g/dL (ref 31.5–35.7)
MCV: 88 fL (ref 79–97)
Monocytes Absolute: 0.3 x10E3/uL (ref 0.1–0.9)
Monocytes: 8 %
Neutrophils Absolute: 1.5 x10E3/uL (ref 1.4–7.0)
Neutrophils: 41 %
Platelets: 216 x10E3/uL (ref 150–450)
RBC: 5.13 x10E6/uL (ref 4.14–5.80)
RDW: 13.8 % (ref 11.6–15.4)
WBC: 3.5 x10E3/uL (ref 3.4–10.8)

## 2024-09-27 LAB — COMPREHENSIVE METABOLIC PANEL WITH GFR
ALT: 44 IU/L (ref 0–44)
AST: 21 IU/L (ref 0–40)
Albumin: 4.6 g/dL (ref 4.3–5.2)
Alkaline Phosphatase: 63 IU/L (ref 47–123)
BUN/Creatinine Ratio: 13 (ref 9–20)
BUN: 11 mg/dL (ref 6–20)
Bilirubin Total: 0.3 mg/dL (ref 0.0–1.2)
CO2: 22 mmol/L (ref 20–29)
Calcium: 9.6 mg/dL (ref 8.7–10.2)
Chloride: 102 mmol/L (ref 96–106)
Creatinine, Ser: 0.86 mg/dL (ref 0.76–1.27)
Globulin, Total: 2.5 g/dL (ref 1.5–4.5)
Glucose: 93 mg/dL (ref 70–99)
Potassium: 4.6 mmol/L (ref 3.5–5.2)
Sodium: 142 mmol/L (ref 134–144)
Total Protein: 7.1 g/dL (ref 6.0–8.5)
eGFR: 122 mL/min/1.73

## 2024-09-27 LAB — LAMOTRIGINE LEVEL: Lamotrigine Lvl: 5.8 ug/mL (ref 2.0–20.0)

## 2024-09-27 LAB — VALPROIC ACID LEVEL: Valproic Acid Lvl: 79 ug/mL (ref 50–100)

## 2025-08-07 ENCOUNTER — Ambulatory Visit: Admitting: Adult Health
